# Patient Record
Sex: Male | Born: 1945 | Race: White | Hispanic: No | Marital: Married | Smoking: Never smoker
Health system: Southern US, Community
[De-identification: ages and names within clinical notes are randomized; demographics above are authoritative.]

## PROBLEM LIST (undated history)

## (undated) DIAGNOSIS — R972 Elevated prostate specific antigen [PSA]: Secondary | ICD-10-CM

## (undated) DIAGNOSIS — E785 Hyperlipidemia, unspecified: Secondary | ICD-10-CM

## (undated) DIAGNOSIS — N5089 Other specified disorders of the male genital organs: Secondary | ICD-10-CM

## (undated) DIAGNOSIS — K644 Residual hemorrhoidal skin tags: Secondary | ICD-10-CM

## (undated) HISTORY — DX: Residual hemorrhoidal skin tags: K64.4

## (undated) HISTORY — DX: Elevated prostate specific antigen (PSA): R97.20

## (undated) HISTORY — DX: Gilbert syndrome: E80.4

## (undated) HISTORY — DX: Hyperlipidemia, unspecified: E78.5

## (undated) HISTORY — DX: Other specified disorders of the male genital organs: N50.89

---

## 2001-10-28 ENCOUNTER — Ambulatory Visit (HOSPITAL_BASED_OUTPATIENT_CLINIC_OR_DEPARTMENT_OTHER): Admission: RE | Admit: 2001-10-28 | Discharge: 2001-10-28 | Payer: Self-pay | Admitting: Orthopedic Surgery

## 2001-10-28 ENCOUNTER — Encounter (INDEPENDENT_AMBULATORY_CARE_PROVIDER_SITE_OTHER): Payer: Self-pay | Admitting: *Deleted

## 2003-10-09 HISTORY — PX: HAND SURGERY: SHX662

## 2009-04-25 ENCOUNTER — Ambulatory Visit: Payer: Self-pay | Admitting: Family Medicine

## 2009-04-25 DIAGNOSIS — E785 Hyperlipidemia, unspecified: Secondary | ICD-10-CM

## 2009-04-25 DIAGNOSIS — M25559 Pain in unspecified hip: Secondary | ICD-10-CM | POA: Insufficient documentation

## 2009-04-26 ENCOUNTER — Ambulatory Visit: Payer: Self-pay | Admitting: Family Medicine

## 2009-04-26 DIAGNOSIS — R5381 Other malaise: Secondary | ICD-10-CM

## 2009-04-26 DIAGNOSIS — R5383 Other fatigue: Secondary | ICD-10-CM

## 2009-04-26 LAB — CONVERTED CEMR LAB
ALT: 26 units/L (ref 0–53)
Albumin: 4 g/dL (ref 3.5–5.2)
Alkaline Phosphatase: 60 units/L (ref 39–117)
Basophils Absolute: 0 10*3/uL (ref 0.0–0.1)
Bilirubin, Direct: 0.2 mg/dL (ref 0.0–0.3)
CO2: 31 meq/L (ref 19–32)
Calcium: 9.1 mg/dL (ref 8.4–10.5)
Chloride: 107 meq/L (ref 96–112)
Cholesterol: 219 mg/dL — ABNORMAL HIGH (ref 0–200)
Direct LDL: 172.4 mg/dL
Eosinophils Absolute: 0.2 10*3/uL (ref 0.0–0.7)
Glucose, Bld: 96 mg/dL (ref 70–99)
Hemoglobin: 15.4 g/dL (ref 13.0–17.0)
Lymphocytes Relative: 23.9 % (ref 12.0–46.0)
MCHC: 35 g/dL (ref 30.0–36.0)
Monocytes Relative: 11.1 % (ref 3.0–12.0)
Neutrophils Relative %: 58.4 % (ref 43.0–77.0)
PSA: 1.7 ng/mL (ref 0.10–4.00)
Platelets: 234 10*3/uL (ref 150.0–400.0)
RDW: 12.5 % (ref 11.5–14.6)
Sodium: 142 meq/L (ref 135–145)
Total CHOL/HDL Ratio: 5
Total Protein: 7.1 g/dL (ref 6.0–8.3)
Triglycerides: 55 mg/dL (ref 0.0–149.0)

## 2009-05-02 ENCOUNTER — Ambulatory Visit: Payer: Self-pay | Admitting: Family Medicine

## 2011-02-23 NOTE — Op Note (Signed)
[place]  Patient:    ULES, MARSALA Visit Number: [id number] MRN: [id number]          Service Type: DSU Location: [facility] Attending Physician:  Susa Day Dictated by:   Katy Fitch Naaman Plummer., M.D. Proc. Date: 10/28/01 Admit Date:  10/28/2001                             Operative Report  PREOPERATIVE DIAGNOSIS:  Dupuytrens contracture, right hand involving thumb, long and ring fingers with significant metacarpophalangeal flexion contractures of the long and ring fingers and extensive nodule involvement in the proximal phalangeal segments of the long and ring fingers.  POSTOPERATIVE DIAGNOSIS:  Dupuytrens contracture, right hand involving thumb, long and ring fingers with significant metacarpophalangeal flexion contractures of the long and ring fingers and extensive nodule involvement in the proximal phalangeal segments of the long and ring fingers.  OPERATION PERFORMED: 1. Excision of complex Dupuytrens contracture, right palm and ring finger. 2. Excision of Dupuytrens contracture, right long finger.  SURGEON:  Katy Fitch. Sypher, Montez Hageman., M.D.  ASSISTANT:  Jonni Sanger, P.A.  ANESTHESIA:  General by LMA.  SUPERVISING ANESTHESIOLOGIST:  Dr. Gelene Mink.  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  In 1988 he sustained an electrocution injury that burned his right hand.  Shortly following this injury he developed an early Dupuytrens contracture affecting his palm and metacarpophalangeal region of his long and ring fingers on the right. This has gradually progressed over the past 14 years to where he had developed a significant MP flexion contracture of the long and  ring fingers and extensive nodular disease of the palm in the region of the natatory ligaments between the long and ring fingers.  He had difficulty abducting  his long and ring fingers due to this involvement.  He requested surgical excision.  DESCRIPTION OF PROCEDURE:  Lavena Stanford, Montez Hageman. was brought to the operating room and placed in supine position on the operating table.  Following induction of general anesthesia by LMA, the right arm was prepped with Betadine soap and solution and sterilely draped.  Following exsanguination of the limb with an Esmarch bandage, the arterial tourniquet was inflated to 220 mHg.  The procedure commenced with planning of Brunner zigzag incisions. The skin flaps were elevated with scalpel and scissors dissection exposing the palmar fascia.  The pathologic palmar fascia was excised involving the pretendinous fibers to the long and ring fingers.  A very complex dissection in the region of the MP joints and proximal phalangeal segments of the ring and small fingers removed pretendinous disease.  A significant spiral band on the radial aspect of the ring finger and a very extensive lateral sheet and natatory ligament contracture between the long and ring fingers.  There was extensive disease along the lateral fascial sheet of the long finger.  The neurovascular bundle, particularly for the long and ring fingers was extensively involved with very complex dissection of the radial proper digital artery and nerve of the ring finger.  Ultimately, we were able to remove all the nodular fibromatosis without injury to the primary branches of the common digital arteries, proper digital arteries, common digital nerves, proper digital nerves.  The flexion contracture of the MP and PIP joints was fully relieved.  Bleeding points were electrocauterized throughout the dissection with bipolar forceps. The  wound was then lavaged with sterile saline followed by skin reduction in the ring finger to allow closure without dead space.  The wounds were then repaired with corner sutures of 5-0 nylon and multiple interrupted sutures  of 5-0 nylon.  The incisions were dressed with Silvadene, Xeroflo, sterile gauze, sterile Kerlix, Webril, acrylic fluff and a palmar splint maintaining the MP and PIP joints in full extension.  There were no apparent complications.  The patient tolerated the surgery and anesthesia well and was transferred to the recovery room with stable vital signs.  For aftercare he was given prescriptions for Percocet 5 mg 1 or 2 tablets p.o. q.4-6h. p.r.n. pain, 30 tablets without refill; also, Keflex 500 mg 1 p.o. q.8h. x 4 days as prophylactic antibiotic and Motrin 600 mg 1 p.o. q.6h. p.r.n. pain. Dictated by:   Katy Fitch Naaman Plummer., M.D. Attending Physician:  Susa Day DD:  10/28/01 TD:  10/28/01 Job: [phone] [id number]

## 2011-12-31 ENCOUNTER — Other Ambulatory Visit: Payer: Self-pay | Admitting: Family Medicine

## 2011-12-31 DIAGNOSIS — E785 Hyperlipidemia, unspecified: Secondary | ICD-10-CM

## 2011-12-31 DIAGNOSIS — R5381 Other malaise: Secondary | ICD-10-CM

## 2011-12-31 DIAGNOSIS — Z125 Encounter for screening for malignant neoplasm of prostate: Secondary | ICD-10-CM

## 2012-01-03 ENCOUNTER — Other Ambulatory Visit (INDEPENDENT_AMBULATORY_CARE_PROVIDER_SITE_OTHER): Payer: 59

## 2012-01-03 DIAGNOSIS — Z125 Encounter for screening for malignant neoplasm of prostate: Secondary | ICD-10-CM

## 2012-01-03 DIAGNOSIS — R5383 Other fatigue: Secondary | ICD-10-CM

## 2012-01-03 DIAGNOSIS — E785 Hyperlipidemia, unspecified: Secondary | ICD-10-CM

## 2012-01-03 LAB — COMPREHENSIVE METABOLIC PANEL
ALT: 18 U/L (ref 0–53)
BUN: 15 mg/dL (ref 6–23)
CO2: 31 mEq/L (ref 19–32)
Calcium: 9.2 mg/dL (ref 8.4–10.5)
Chloride: 103 mEq/L (ref 96–112)
Creatinine, Ser: 1.1 mg/dL (ref 0.4–1.5)
GFR: 72.6 mL/min (ref >60.00)
Glucose, Bld: 90 mg/dL (ref 70–99)
Total Bilirubin: 1.2 mg/dL (ref 0.3–1.2)

## 2012-01-03 LAB — LIPID PANEL
Cholesterol: 220 mg/dL — ABNORMAL HIGH (ref 0–200)
Total CHOL/HDL Ratio: 5
Triglycerides: 59 mg/dL (ref 0.0–149.0)

## 2012-01-03 LAB — CBC WITH DIFFERENTIAL/PLATELET
Basophils Absolute: 0 10*3/uL (ref 0.0–0.1)
Eosinophils Relative: 6.8 % — ABNORMAL HIGH (ref 0.0–5.0)
Hemoglobin: 15.1 g/dL (ref 13.0–17.0)
Lymphocytes Relative: 26 % (ref 12.0–46.0)
Monocytes Relative: 11.6 % (ref 3.0–12.0)
Neutro Abs: 2.5 10*3/uL (ref 1.4–7.7)
RDW: 13 % (ref 11.5–14.6)
WBC: 4.5 10*3/uL (ref 4.5–10.5)

## 2012-01-03 LAB — PSA: PSA: 3.12 ng/mL (ref 0.10–4.00)

## 2012-01-03 LAB — LDL CHOLESTEROL, DIRECT: Direct LDL: 176 mg/dL

## 2012-01-09 ENCOUNTER — Encounter: Payer: Self-pay | Admitting: Family Medicine

## 2012-01-10 ENCOUNTER — Encounter: Payer: Self-pay | Admitting: Family Medicine

## 2012-01-10 ENCOUNTER — Ambulatory Visit (INDEPENDENT_AMBULATORY_CARE_PROVIDER_SITE_OTHER): Payer: 59 | Admitting: Family Medicine

## 2012-01-10 VITALS — BP 120/82 | HR 54 | Temp 98.9°F | Ht 73.0 in | Wt 218.8 lb

## 2012-01-10 DIAGNOSIS — Z23 Encounter for immunization: Secondary | ICD-10-CM

## 2012-01-10 DIAGNOSIS — Z0000 Encounter for general adult medical examination without abnormal findings: Secondary | ICD-10-CM

## 2012-01-10 NOTE — Progress Notes (Signed)
Patient Name: Henry Kirk Date of Birth: January 14, 1946 Medical Record Number: [id number] Gender: male Date of Encounter: 01/10/2012  History of Present Illness:  Henry Kirk is a 66 y.o. very pleasant male patient who presents with the following:  Pneumovax Colonoscopy - needs recall.   Does have a family history of prostate cancer  Chol 220 LDL 170 No family history of CAD No smoker  Body mass index is 28.87 kg/(m^2).  Has not done as much this winter  Preventative Health Maintenance Visit:  Health Maintenance Summary Reviewed and updated, unless pt declines services.  Tobacco History Reviewed. Alcohol: No concerns, no excessive use Exercise Habits: Some activity, rec at least 30 mins 5 times a week STD concerns: no risk or activity to increase risk Drug Use: None Encouraged self-testicular check  Health Maintenance  Topic Date Due  . Tetanus/tdap  07/10/1964  . Pneumococcal Polysaccharide Vaccine Age 68 And Over  07/10/2010  . Colonoscopy  10/08/2010  . Influenza Vaccine  07/08/2012  . Zostavax  Completed   Labs reviewed with the patient.   Lipids:    Component Value Date/Time   CHOL 220* 01/03/2012 0839   TRIG 59.0 01/03/2012 0839   HDL 46.50 01/03/2012 0839   LDLDIRECT 176.0 01/03/2012 0839   VLDL 11.8 01/03/2012 0839   CHOLHDL 5 01/03/2012 0839    CBC:    Component Value Date/Time   WBC 4.5 01/03/2012 0839   HGB 15.1 01/03/2012 0839   HCT 43.9 01/03/2012 0839   PLT 243.0 01/03/2012 0839   MCV 93.3 01/03/2012 0839   NEUTROABS 2.5 01/03/2012 0839   LYMPHSABS 1.2 01/03/2012 0839   MONOABS 0.5 01/03/2012 0839   EOSABS 0.3 01/03/2012 0839   BASOSABS 0.0 01/03/2012 0839    Basic Metabolic Panel:    Component Value Date/Time   NA 141 01/03/2012 0839   K 4.6 01/03/2012 0839   CL 103 01/03/2012 0839   CO2 31 01/03/2012 0839   BUN 15 01/03/2012 0839   CREATININE 1.1 01/03/2012 0839   GLUCOSE 90 01/03/2012 0839   CALCIUM 9.2 01/03/2012 0839    Lab Results    Component Value Date   ALT 18 01/03/2012   AST 25 01/03/2012   ALKPHOS 66 01/03/2012   BILITOT 1.2 01/03/2012    Lab Results  Component Value Date   PSA 3.12 01/03/2012   PSA 1.70 04/26/2009    Patient Active Problem List  Diagnoses  . HYPERLIPIDEMIA  . HIP PAIN, RIGHT  . OTHER MALAISE AND FATIGUE   Past Medical History  Diagnosis Date  . Hyperlipidemia   . Elevated PSA     with prostatitis  . Gilbert's syndrome   . Hemorrhoids, external   . Spermatic cord cyst    No past surgical history on file. History  Substance Use Topics  . Smoking status: Never Smoker   . Smokeless tobacco: Not on file  . Alcohol Use: No   Family History  Problem Relation Age of Onset  . Cancer Father     back  . Cancer Son     testicular cancer  . Cancer Paternal Grandfather     back   No Known Allergies  Medication list has been reviewed and updated.  Review of Systems:  General: Denies fever, chills, sweats. No significant weight loss. Eyes: Denies blurring,significant itching ENT: Denies earache, sore throat, and hoarseness. Cardiovascular: Denies chest pains, palpitations, dyspnea on exertion Respiratory: Denies cough, dyspnea at rest,wheeezing Breast: no concerns about lumps GI: Denies  nausea, vomiting, diarrhea, constipation, change in bowel habits, abdominal pain, melena, hematochezia GU: Denies penile discharge, ED, urinary flow / outflow problems. No STD concerns. Musculoskeletal: Denies back pain, joint pain Derm: Denies rash, itching Neuro: Denies  paresthesias, frequent falls, frequent headaches Psych: Denies depression, anxiety Endocrine: Denies cold intolerance, heat intolerance, polydipsia Heme: Denies enlarged lymph nodes Allergy: No hayfever   Physical Examination: Filed Vitals:   01/10/12 0822  BP: 120/82  Pulse: 54  Temp: 98.9 F (37.2 C)  TempSrc: Oral  Height: 6\' 1"  (1.854 m)  Weight: 218 lb 12.8 oz (99.247 kg)  SpO2: 98%    Body mass index is  28.87 kg/(m^2).   Wt Readings from Last 3 Encounters:  01/10/12 218 lb 12.8 oz (99.247 kg)  05/02/09 209 lb 3.2 oz (94.892 kg)  04/25/09 211 lb 3.2 oz (95.8 kg)    GEN: well developed, well nourished, no acute distress Eyes: conjunctiva and lids normal, PERRLA, EOMI ENT: TM clear, nares clear, oral exam WNL Neck: supple, no lymphadenopathy, no thyromegaly, no JVD Pulm: clear to auscultation and percussion, respiratory effort normal CV: regular rate and rhythm, S1-S2, no murmur, rub or gallop, no bruits, peripheral pulses normal and symmetric, no cyanosis, clubbing, edema or varicosities Chest: no scars, masses GI: soft, non-tender; no hepatosplenomegaly, masses; active bowel sounds all quadrants GU: no hernia, testicular mass, penile discharge, or prostate enlargement Lymph: no cervical, axillary or inguinal adenopathy MSK: gait normal, muscle tone and strength WNL, no joint swelling, effusions, discoloration, crepitus  SKIN: clear, good turgor, color WNL, no rashes, lesions, or ulcerations Neuro: normal mental status, normal strength, sensation, and motion Psych: alert; oriented to person, place and time, normally interactive and not anxious or depressed in appearance.   Assessment and Plan: 1. Routine general medical examination at a health care facility     The patient's preventative maintenance and recommended screening tests for an annual wellness exam were reviewed in full today. Brought up to date unless services declined.  Counselled on the importance of diet, exercise, and its role in overall health and mortality. The patient's FH and SH was reviewed, including their home life, tobacco status, and drug and alcohol status.   Pneumovax Needs colon recall- he wants to call his wife's GI to set it up

## 2013-01-14 ENCOUNTER — Ambulatory Visit (INDEPENDENT_AMBULATORY_CARE_PROVIDER_SITE_OTHER): Payer: Medicare Other | Admitting: Family Medicine

## 2013-01-14 ENCOUNTER — Encounter: Payer: Self-pay | Admitting: Family Medicine

## 2013-01-14 VITALS — BP 110/74 | HR 52 | Temp 97.4°F | Ht 73.0 in | Wt 227.8 lb

## 2013-01-14 DIAGNOSIS — R5381 Other malaise: Secondary | ICD-10-CM

## 2013-01-14 DIAGNOSIS — N400 Enlarged prostate without lower urinary tract symptoms: Secondary | ICD-10-CM

## 2013-01-14 DIAGNOSIS — E785 Hyperlipidemia, unspecified: Secondary | ICD-10-CM

## 2013-01-14 DIAGNOSIS — Z0000 Encounter for general adult medical examination without abnormal findings: Secondary | ICD-10-CM

## 2013-01-14 DIAGNOSIS — R5383 Other fatigue: Secondary | ICD-10-CM

## 2013-01-14 LAB — LIPID PANEL
Cholesterol: 234 mg/dL — ABNORMAL HIGH (ref 0–200)
HDL: 38.6 mg/dL — ABNORMAL LOW (ref >39.00)
Total CHOL/HDL Ratio: 6
Triglycerides: 91 mg/dL (ref 0.0–149.0)
VLDL: 18.2 mg/dL (ref 0.0–40.0)

## 2013-01-14 LAB — CBC WITH DIFFERENTIAL/PLATELET
Basophils Absolute: 0 10*3/uL (ref 0.0–0.1)
Eosinophils Absolute: 0.4 10*3/uL (ref 0.0–0.7)
Lymphocytes Relative: 23.4 % (ref 12.0–46.0)
MCHC: 34.2 g/dL (ref 30.0–36.0)
MCV: 93.5 fl (ref 78.0–100.0)
Monocytes Absolute: 0.5 10*3/uL (ref 0.1–1.0)
Neutro Abs: 2.7 10*3/uL (ref 1.4–7.7)
Neutrophils Relative %: 56 % (ref 43.0–77.0)
RDW: 13.3 % (ref 11.5–14.6)

## 2013-01-14 LAB — BASIC METABOLIC PANEL
BUN: 12 mg/dL (ref 6–23)
Chloride: 104 mEq/L (ref 96–112)
Creatinine, Ser: 1.1 mg/dL (ref 0.4–1.5)
GFR: 71.61 mL/min (ref >60.00)
Potassium: 4 mEq/L (ref 3.5–5.1)

## 2013-01-14 LAB — PSA: PSA: 2.23 ng/mL (ref 0.10–4.00)

## 2013-01-14 LAB — HEPATIC FUNCTION PANEL
ALT: 21 U/L (ref 0–53)
Bilirubin, Direct: 0.3 mg/dL (ref 0.0–0.3)
Total Bilirubin: 2.5 mg/dL — ABNORMAL HIGH (ref 0.3–1.2)

## 2013-01-14 NOTE — Progress Notes (Signed)
Nature conservation officer at [facility] [address] [place] [address] Phone: [phone] Fax: [phone]  Date:  01/14/2013   Name:  Henry Kirk   DOB:  1946-01-27   MRN:  [id number] Gender: male Age: 67 y.o.  Primary Physician:  Hannah Beat, MD  Evaluating MD: Hannah Beat, MD   Chief Complaint: Annual Exam   History of Present Illness:  Henry Kirk is a 67 y.o. pleasant patient who presents with the following:  Medicare CPX:  F/u colon: [facility].  Benign tremor? Tremor in his head sometimes. Cannot keep her head back. Body pushes head farther.   Wt Readings from Last 3 Encounters:  01/14/13 227 lb 12 oz (103.307 kg)  01/10/12 218 lb 12.8 oz (99.247 kg)  05/02/09 209 lb 3.2 oz (94.892 kg)   Preventative Health Maintenance Visit:  Health Maintenance Summary Reviewed and updated, unless pt declines services.  Tobacco History Reviewed. Alcohol: No concerns, no excessive use Exercise Habits: none currently STD concerns: no risk or activity to increase risk Drug Use: None Encouraged self-testicular check  Health Maintenance  Topic Date Due  . Tetanus/tdap  07/10/1964  . Colonoscopy  10/08/2010  . Influenza Vaccine  06/08/2013  . Pneumococcal Polysaccharide Vaccine Age 14 And Over  Completed  . Zostavax  Completed    Labs reviewed with the patient.  Results for orders placed in visit on 01/14/13  CBC WITH DIFFERENTIAL      Result Value Range   WBC 4.9  4.5 - 10.5 K/uL   RBC 4.73  4.22 - 5.81 Mil/uL   Hemoglobin 15.1  13.0 - 17.0 g/dL   HCT 29.5  28.4 - 13.2 %   MCV 93.5  78.0 - 100.0 fl   MCHC 34.2  30.0 - 36.0 g/dL   RDW 44.0  10.2 - 72.5 %   Platelets 267.0  150.0 - 400.0 K/uL   Neutrophils Relative 56.0  43.0 - 77.0 %   Lymphocytes Relative 23.4  12.0 - 46.0 %   Monocytes Relative 10.8  3.0 - 12.0 %   Eosinophils Relative 8.8 (*) 0.0 - 5.0 %   Basophils Relative 1.0  0.0 - 3.0 %   Neutro Abs 2.7  1.4 - 7.7 K/uL   Lymphs Abs  1.1  0.7 - 4.0 K/uL   Monocytes Absolute 0.5  0.1 - 1.0 K/uL   Eosinophils Absolute 0.4  0.0 - 0.7 K/uL   Basophils Absolute 0.0  0.0 - 0.1 K/uL  BASIC METABOLIC PANEL      Result Value Range   Sodium 141  135 - 145 mEq/L   Potassium 4.0  3.5 - 5.1 mEq/L   Chloride 104  96 - 112 mEq/L   CO2 30  19 - 32 mEq/L   Glucose, Bld 90  70 - 99 mg/dL   BUN 12  6 - 23 mg/dL   Creatinine, Ser 1.1  0.4 - 1.5 mg/dL   Calcium 9.1  8.4 - 36.6 mg/dL   GFR 44.03  >47.42 mL/min  HEPATIC FUNCTION PANEL      Result Value Range   Total Bilirubin 2.5 (*) 0.3 - 1.2 mg/dL   Bilirubin, Direct 0.3  0.0 - 0.3 mg/dL   Alkaline Phosphatase 63  39 - 117 U/L   AST 26  0 - 37 U/L   ALT 21  0 - 53 U/L   Total Protein 7.0  6.0 - 8.3 g/dL   Albumin 4.0  3.5 - 5.2 g/dL  LIPID PANEL      Result Value Range   Cholesterol 234 (*) 0 - 200 mg/dL   Triglycerides 78.2  0.0 - 149.0 mg/dL   HDL 95.62 (*) >13.08 mg/dL   VLDL 65.7  0.0 - 84.6 mg/dL   Total CHOL/HDL Ratio 6    PSA      Result Value Range   PSA 2.23  0.10 - 4.00 ng/mL  LDL CHOLESTEROL, DIRECT      Result Value Range   Direct LDL 177.5        Patient Active Problem List  Diagnosis  . HYPERLIPIDEMIA  . HIP PAIN, RIGHT  . OTHER MALAISE AND FATIGUE    Past Medical History  Diagnosis Date  . Hyperlipidemia   . Elevated PSA     with prostatitis  . Gilbert's syndrome   . Hemorrhoids, external   . Spermatic cord cyst     History reviewed. No pertinent past surgical history.  History   Social History  . Marital Status: Married    Spouse Name: N/A    Number of Children: N/A  . Years of Education: N/A   Occupational History  . electrician Nurse, adult [employer]   Social History Main Topics  . Smoking status: Never Smoker   . Smokeless tobacco: Not on file  . Alcohol Use: No  . Drug Use: No  . Sexually Active: Not on file   Other Topics Concern  . Not on file   Social History Narrative  . No narrative on file    Family History    Problem Relation Age of Onset  . Cancer Father     back  . Cancer Son     testicular cancer  . Cancer Paternal Grandfather     back    No Known Allergies  Medication list has been reviewed and updated.  No outpatient prescriptions prior to visit.   No facility-administered medications prior to visit.    Review of Systems:   General: Denies fever, chills, sweats. No significant weight loss. Eyes: Denies blurring,significant itching ENT: Denies earache, sore throat, and hoarseness. Cardiovascular: Denies chest pains, palpitations, dyspnea on exertion Respiratory: Denies cough, dyspnea at rest,wheeezing Breast: no concerns about lumps GI: Denies nausea, vomiting, diarrhea, constipation, change in bowel habits, abdominal pain, melena, hematochezia GU: Denies penile discharge, ED, urinary flow / outflow problems. No STD concerns. Musculoskeletal: Denies back pain, joint pain Derm: Denies rash, itching Neuro: TREMOR AS ABOVE, WHICH RUNS IN HIS FAMILY. SLIGHTLY WORSENED WITH AGE. Psych: Denies depression, anxiety Endocrine: Denies cold intolerance, heat intolerance, polydipsia Heme: Denies enlarged lymph nodes Allergy: No hayfever   Physical Examination: BP 110/74  Pulse 52  Temp(Src) 97.4 F (36.3 C) (Oral)  Ht 6\' 1"  (1.854 m)  Wt 227 lb 12 oz (103.307 kg)  BMI 30.05 kg/m2  SpO2 96%  Ideal Body Weight: Weight in (lb) to have BMI = 25: 189.1   Wt Readings from Last 3 Encounters:  01/14/13 227 lb 12 oz (103.307 kg)  01/10/12 218 lb 12.8 oz (99.247 kg)  05/02/09 209 lb 3.2 oz (94.892 kg)    GEN: well developed, well nourished, no acute distress Eyes: conjunctiva and lids normal, PERRLA, EOMI ENT: TM clear, nares clear, oral exam WNL Neck: supple, no lymphadenopathy, no thyromegaly, no JVD Pulm: clear to auscultation and percussion, respiratory effort normal CV: regular rate and rhythm, S1-S2, no murmur, rub or gallop, no bruits, peripheral pulses normal and  symmetric, no cyanosis, clubbing, edema or varicosities Chest:  no scars, masses GI: soft, non-tender; no hepatosplenomegaly, masses; active bowel sounds all quadrants GU: no hernia, testicular mass, penile discharge, or prostate enlargement Lymph: no cervical, axillary or inguinal adenopathy MSK: gait normal, muscle tone and strength WNL, no joint swelling, effusions, discoloration, crepitus  SKIN: clear, good turgor, color WNL, no rashes, lesions, or ulcerations Neuro: normal mental status, normal strength, sensation, and motion Psych: alert; oriented to person, place and time, normally interactive and not anxious or depressed in appearance.  Assessment and Plan:  Routine general medical examination at a health care facility  HYPERLIPIDEMIA - Plan: Lipid panel  BPH (benign prostatic hyperplasia) - Plan: PSA  Other malaise and fatigue - Plan: CBC with Differential, Basic metabolic panel, Hepatic function panel  I have personally reviewed the Medicare Annual Wellness questionnaire and have noted 1. The patient's medical and social history 2. Their use of alcohol, tobacco or illicit drugs 3. Their current medications and supplements 4. The patient's functional ability including ADL's, fall risks, home safety risks and hearing or visual             impairment. 5. Diet and physical activities 6. Evidence for depression or mood disorders  The patients weight, height, BMI and visual acuity have been recorded in the chart I have made referrals, counseling and provided education to the patient based review of the above and I have provided the pt with a written personalized care plan for preventive services.  I have provided the patient with a copy of your personalized plan for preventive services. Instructed to take the time to review along with their updated medication list.   Recommended colonoscopy. He wants to check with his wife, and find out who did her colonoscopy and arrange for  this himself.  Orders Today:  Orders Placed This Encounter  Procedures  . CBC with Differential  . Basic metabolic panel  . Hepatic function panel  . Lipid panel  . PSA  . LDL cholesterol, direct    Updated Medication List: (Includes new medications, updates to list, dose adjustments) No orders of the defined types were placed in this encounter.    Medications Discontinued: There are no discontinued medications.     Signed, Elpidio Galea. Sanvika Cuttino, MD 01/14/2013 8:44 AM

## 2013-01-15 ENCOUNTER — Encounter: Payer: Self-pay | Admitting: *Deleted

## 2013-09-11 ENCOUNTER — Encounter: Payer: Self-pay | Admitting: *Deleted

## 2014-07-01 ENCOUNTER — Encounter: Payer: Self-pay | Admitting: Family Medicine

## 2014-07-01 ENCOUNTER — Ambulatory Visit (INDEPENDENT_AMBULATORY_CARE_PROVIDER_SITE_OTHER): Payer: Medicare Other | Admitting: Family Medicine

## 2014-07-01 VITALS — BP 120/78 | HR 60 | Temp 97.9°F | Wt 218.0 lb

## 2014-07-01 DIAGNOSIS — G252 Other specified forms of tremor: Secondary | ICD-10-CM

## 2014-07-01 DIAGNOSIS — E785 Hyperlipidemia, unspecified: Secondary | ICD-10-CM

## 2014-07-01 DIAGNOSIS — Z23 Encounter for immunization: Secondary | ICD-10-CM

## 2014-07-01 DIAGNOSIS — Z125 Encounter for screening for malignant neoplasm of prostate: Secondary | ICD-10-CM

## 2014-07-01 DIAGNOSIS — G250 Essential tremor: Secondary | ICD-10-CM | POA: Insufficient documentation

## 2014-07-01 LAB — LIPID PANEL
Cholesterol: 263 mg/dL — ABNORMAL HIGH (ref 0–200)
HDL: 46.6 mg/dL (ref >39.00)
LDL Cholesterol: 195 mg/dL — ABNORMAL HIGH (ref 0–99)
NonHDL: 216.4
TRIGLYCERIDES: 105 mg/dL (ref 0.0–149.0)
Total CHOL/HDL Ratio: 6
VLDL: 21 mg/dL (ref 0.0–40.0)

## 2014-07-01 LAB — COMPREHENSIVE METABOLIC PANEL
ALK PHOS: 67 U/L (ref 39–117)
ALT: 20 U/L (ref 0–53)
AST: 28 U/L (ref 0–37)
Albumin: 4.1 g/dL (ref 3.5–5.2)
BUN: 11 mg/dL (ref 6–23)
CALCIUM: 9.4 mg/dL (ref 8.4–10.5)
CHLORIDE: 102 meq/L (ref 96–112)
CO2: 34 mEq/L — ABNORMAL HIGH (ref 19–32)
Creatinine, Ser: 1.2 mg/dL (ref 0.4–1.5)
GFR: 67.02 mL/min (ref >60.00)
GLUCOSE: 82 mg/dL (ref 70–99)
Potassium: 4.6 mEq/L (ref 3.5–5.1)
SODIUM: 140 meq/L (ref 135–145)
TOTAL PROTEIN: 7.1 g/dL (ref 6.0–8.3)
Total Bilirubin: 1.6 mg/dL — ABNORMAL HIGH (ref 0.2–1.2)

## 2014-07-01 LAB — TSH: TSH: 1.31 u[IU]/mL (ref 0.35–4.50)

## 2014-07-01 LAB — PSA: PSA: 2.37 ng/mL (ref 0.10–4.00)

## 2014-07-01 NOTE — Progress Notes (Signed)
Henry Reddish, MD Phone: [phone]  Subjective:  Patient presents today to establish care with me as their new primary care provider. Patient was formerly a patient of Dr. Lorelei Pont. Chief complaint-noted.   Hyperlipidemia Last LDL >160.  Discussed likely elevated cardiac risk. Check lipids today to calculate 10 year risk.  ROS- no chest pain or shortness of breath.   Tremor Since he was a teenager. Slightly worsening over the years. Mainly intention. Occasionally head will have fine tremor as well. Strong family history of this with mother having difficulty with cups of liquids in her later years.  ROS-no rigidity, no falls, no confusion  The following were reviewed and entered/updated in epic: Past Medical History  Diagnosis Date  . Hyperlipidemia   . Elevated PSA     with prostatitis  . Gilbert's syndrome   . Hemorrhoids, external   . Spermatic cord cyst    Patient Active Problem List   Diagnosis Date Noted  . Essential tremor 07/01/2014    Priority: Medium  . 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 HYPERLIPIDEMIA 04/25/2009    Priority: Medium  . Other malaise and fatigue 04/26/2009    Priority: Low   Past Surgical History  Procedure Laterality Date  . Hand surgery  2005    "lumps on head"    Family History  Problem Relation Age of Onset  . Cancer Father     spinal 73  . Cancer Son     testicular cancer  . Cancer Paternal Grandfather     spinal 94  . Prostate cancer Maternal Uncle     did not die of    Medications- reviewed and updated No current outpatient prescriptions on file.   No current facility-administered medications for this visit.    Allergies-reviewed and updated No Known Allergies  History   Social History  . Marital Status: Married    Spouse Name: N/A    Number of Children: N/A  . Years of Education: N/A   Occupational History  . electrician Engineer, production [employer]   Social History Main Topics  . Smoking status:  Never Smoker   . Smokeless tobacco: None  . Alcohol Use: No  . Drug Use: No  . Sexual Activity: None   Other Topics Concern  . None   Social History Narrative   Married 1966. 2 sons Randall Hiss, not married and no kids, and Larkin Ina with 1 daughter, not married. All children live at home.       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       Hobbies: yardwork, working at [employer].     ROS--See HPI   Objective: BP 120/78  Pulse 60  Temp(Src) 97.9 F (36.6 C)  Wt 218 lb (98.884 kg) Gen: NAD, resting comfortably, moves onto table easily HEENT: Mucous membranes are moist. Oropharynx normal. Good dentition.  Neck: no thyromegaly CV: RRR no murmurs rubs or gallops Lungs: CTAB no crackles, wheeze, rhonchi Abdomen: soft/nontender/nondistended/normal bowel sounds.  Ext: no edema2+ PT pulses and radial pulses Skin: warm, dry, a couple actinic keratosis noted (going to dermatology later this month) Neuro: grossly normal, moves all extremities, PERRLA  Assessment/Plan:  HYPERLIPIDEMIA Discussed likely elevated still. Discussed likely need for statin. Advised regular exercise and diet changes. Patient states unlikely to take statin and in that case-I recommended asa 81mg .   Gilbert's syndrome Check LFTs today  Essential tremor Long term issue worsening slowly with time. Discussed beta blocker  as option. Patient not currently interested.    Fasting labs Orders Placed This Encounter  Procedures  . Tdap vaccine greater than or equal to 7yo IM  . PSA  . Lipid panel    [place]    Order Specific Question:  Has the patient fasted?    Answer:  No  . Comprehensive metabolic panel    [place]    Order Specific Question:  Has the patient fasted?    Answer:  No  . TSH    [place]

## 2014-07-01 NOTE — Patient Instructions (Addendum)
Check labs today.  Likely to recommend cholesterol lowering medicine.  Regardless, recommend 150 minutes exercise per week.  If you do not want cholesterol lowering medicine, would recommend daily aspirin.   You opted for yearly prostate cancer screening. We will do a rectal exam at your next visit.   Suspect your tremor is essential tremor  See you next October and hopefully I will be trained at that time for medicare wellness exam requirements.   Thanks, Dr. Yong Channel  Health Maintenance Due  Topic Date Due  . Tetanus/tdap - today 07/10/1964  . Colonoscopy -strongly recommend, we can schedule for you when you become ready 10/08/2010

## 2014-07-01 NOTE — Assessment & Plan Note (Signed)
Long term issue worsening slowly with time. Discussed beta blocker as option. Patient not currently interested.

## 2014-07-01 NOTE — Assessment & Plan Note (Signed)
Check LFTs today. 

## 2014-07-01 NOTE — Assessment & Plan Note (Signed)
Discussed likely elevated still. Discussed likely need for statin. Advised regular exercise and diet changes. Patient states unlikely to take statin and in that case-I recommended asa 81mg .

## 2015-02-21 DIAGNOSIS — Z85828 Personal history of other malignant neoplasm of skin: Secondary | ICD-10-CM | POA: Diagnosis not present

## 2015-02-21 DIAGNOSIS — L570 Actinic keratosis: Secondary | ICD-10-CM | POA: Diagnosis not present

## 2015-02-21 DIAGNOSIS — Z08 Encounter for follow-up examination after completed treatment for malignant neoplasm: Secondary | ICD-10-CM | POA: Diagnosis not present

## 2015-02-21 DIAGNOSIS — L821 Other seborrheic keratosis: Secondary | ICD-10-CM | POA: Diagnosis not present

## 2015-02-21 DIAGNOSIS — Z1283 Encounter for screening for malignant neoplasm of skin: Secondary | ICD-10-CM | POA: Diagnosis not present

## 2015-06-23 DIAGNOSIS — X32XXXD Exposure to sunlight, subsequent encounter: Secondary | ICD-10-CM | POA: Diagnosis not present

## 2015-06-23 DIAGNOSIS — L820 Inflamed seborrheic keratosis: Secondary | ICD-10-CM | POA: Diagnosis not present

## 2015-06-23 DIAGNOSIS — L570 Actinic keratosis: Secondary | ICD-10-CM | POA: Diagnosis not present

## 2015-06-23 DIAGNOSIS — L821 Other seborrheic keratosis: Secondary | ICD-10-CM | POA: Diagnosis not present

## 2015-07-12 ENCOUNTER — Encounter: Payer: Self-pay | Admitting: Family Medicine

## 2015-07-12 ENCOUNTER — Encounter: Payer: Medicare Other | Admitting: Family Medicine

## 2015-07-12 ENCOUNTER — Ambulatory Visit (INDEPENDENT_AMBULATORY_CARE_PROVIDER_SITE_OTHER): Payer: Medicare Other | Admitting: Family Medicine

## 2015-07-12 VITALS — BP 136/76 | HR 58 | Temp 98.1°F | Ht 73.0 in | Wt 224.0 lb

## 2015-07-12 DIAGNOSIS — E785 Hyperlipidemia, unspecified: Secondary | ICD-10-CM

## 2015-07-12 DIAGNOSIS — Z0000 Encounter for general adult medical examination without abnormal findings: Secondary | ICD-10-CM

## 2015-07-12 DIAGNOSIS — R351 Nocturia: Secondary | ICD-10-CM

## 2015-07-12 DIAGNOSIS — N401 Enlarged prostate with lower urinary tract symptoms: Secondary | ICD-10-CM | POA: Diagnosis not present

## 2015-07-12 DIAGNOSIS — Z1211 Encounter for screening for malignant neoplasm of colon: Secondary | ICD-10-CM

## 2015-07-12 LAB — POCT URINALYSIS DIPSTICK
Bilirubin, UA: NEGATIVE
Glucose, UA: NEGATIVE
LEUKOCYTES UA: NEGATIVE
NITRITE UA: NEGATIVE
PH UA: 6
PROTEIN UA: NEGATIVE
RBC UA: NEGATIVE
Spec Grav, UA: 1.025
Urobilinogen, UA: 1

## 2015-07-12 LAB — COMPREHENSIVE METABOLIC PANEL
ALK PHOS: 65 U/L (ref 39–117)
ALT: 19 U/L (ref 0–53)
AST: 25 U/L (ref 0–37)
Albumin: 4.1 g/dL (ref 3.5–5.2)
BUN: 13 mg/dL (ref 6–23)
CALCIUM: 9.7 mg/dL (ref 8.4–10.5)
CO2: 31 meq/L (ref 19–32)
CREATININE: 1.09 mg/dL (ref 0.40–1.50)
Chloride: 104 mEq/L (ref 96–112)
GFR: 71.08 mL/min (ref >60.00)
GLUCOSE: 78 mg/dL (ref 70–99)
Potassium: 4.4 mEq/L (ref 3.5–5.1)
Sodium: 143 mEq/L (ref 135–145)
TOTAL PROTEIN: 7 g/dL (ref 6.0–8.3)
Total Bilirubin: 1.6 mg/dL — ABNORMAL HIGH (ref 0.2–1.2)

## 2015-07-12 LAB — TSH: TSH: 1.52 u[IU]/mL (ref 0.35–4.50)

## 2015-07-12 LAB — CBC
HCT: 46.3 % (ref 39.0–52.0)
HEMOGLOBIN: 15.6 g/dL (ref 13.0–17.0)
MCHC: 33.7 g/dL (ref 30.0–36.0)
MCV: 93.6 fl (ref 78.0–100.0)
PLATELETS: 257 10*3/uL (ref 150.0–400.0)
RBC: 4.95 Mil/uL (ref 4.22–5.81)
RDW: 13.4 % (ref 11.5–15.5)
WBC: 6.1 10*3/uL (ref 4.0–10.5)

## 2015-07-12 LAB — LIPID PANEL
CHOL/HDL RATIO: 5
Cholesterol: 251 mg/dL — ABNORMAL HIGH (ref 0–200)
HDL: 48.9 mg/dL (ref >39.00)
LDL Cholesterol: 183 mg/dL — ABNORMAL HIGH (ref 0–99)
NONHDL: 202.19
Triglycerides: 94 mg/dL (ref 0.0–149.0)
VLDL: 18.8 mg/dL (ref 0.0–40.0)

## 2015-07-12 LAB — PSA: PSA: 2.22 ng/mL (ref 0.10–4.00)

## 2015-07-12 NOTE — Progress Notes (Signed)
Henry Reddish, MD Phone: [phone]  Subjective:  Patient presents today for their annual physical. Chief complaint-noted.   -Patient has done well since last visit. He continues to have a tremor but not worsening. Continues to not interested in taking cholesterol medicine but has been compliant with aspirin at least every other day ROS- full  review of systems was completed and negative with pertinent: No chest pain shortness of breath. No headache or blurred vision. Does have some nocturia  The following were reviewed and entered/updated in epic: Past Medical History  Diagnosis Date  . Hyperlipidemia   . Elevated PSA     with prostatitis  . Gilbert's syndrome   . Hemorrhoids, external   . Spermatic cord cyst    Patient Active Problem List   Diagnosis Date Noted  . Essential tremor 07/01/2014    Priority: Medium  . 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 Hyperlipidemia 04/25/2009    Priority: Medium  . Prostate cancer screening 07/01/2014    Priority: Low  . Other malaise and fatigue 04/26/2009    Priority: Low   Past Surgical History  Procedure Laterality Date  . Hand surgery  2005    "lumps on head"    Family History  Problem Relation Age of Onset  . Cancer Father     spinal 71  . Cancer Son     testicular cancer  . Cancer Paternal Grandfather     spinal 39  . Prostate cancer Maternal Uncle     did not die of    Medications- reviewed and updated Current Outpatient Prescriptions  Medication Sig Dispense Refill  . aspirin 81 MG tablet Take 81 mg by mouth daily.     No current facility-administered medications for this visit.    Allergies-reviewed and updated No Known Allergies  Social History   Social History  . Marital Status: Married    Spouse Name: N/A  . Number of Children: N/A  . Years of Education: N/A   Occupational History  . electrician Engineer, [employer]   Social History Main Topics  . Smoking status: Never Smoker     . Smokeless tobacco: None  . Alcohol Use: No  . Drug Use: No  . Sexual Activity: Not Asked   Other Topics Concern  . None   Social History Narrative   Married 1966. 2 sons Randall Hiss, not married and no kids, and Larkin Ina with 1 daughter, not married. All children live at home.       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       Hobbies: yardwork, working at [employer].     ROS--See HPI   Objective: BP 136/76 mmHg  Pulse 58  Temp(Src) 98.1 F (36.7 C)  Ht 6\' 1"  (1.854 m)  Wt 224 lb (101.606 kg)  BMI 29.56 kg/m2 Gen: NAD, resting comfortably HEENT: Mucous membranes are moist. Oropharynx normal Neck: no thyromegaly CV: RRR no murmurs rubs or gallops Lungs: CTAB no crackles, wheeze, rhonchi Abdomen: soft/nontender/nondistended/normal bowel sounds. No rebound or guarding.  Rectal: normal tone, diffusely enlarged prostate, no masses or tenderness Ext: no edema, 2+ PT pulses Skin: warm, dry Neuro: grossly normal, moves all extremities, PERRLA   Assessment/Plan:  69 y.o. male presenting for annual physical.  Health Maintenance counseling: 1. Anticipatory guidance: Patient counseled regarding regular dental exams, regular eye exam, wearing seatbelts, wearing sunscreen 2. Risk factor reduction:  Advised patient of need for regular exercise and diet  rich and fruits and vegetables to reduce risk of heart attack and stroke. Exercise lacking. Goal 210 on home scales 3. Immunizations/screenings/ancillary studies- declined flu and pneumonia, will do later Health Maintenance Due  Topic Date Due  . Hepatitis C Screening - declines  11-Apr-1946  . COLONOSCOPY - referred today 10/08/2010  4. Prostate cancer screening- BPH on exam, PSA today Nocturia 3x a night and enlarged prostate 5. Colon cancer screening - as above 6. Skin cancer screening- sees dermatology Dr. Allyn Kenner in [place]  Initial blood pressure was elevated. Controlled on  repeat. Do not like trend regardless. We will plan on BP home monitoring with follow-up per AVS. Follow-up 6-8 weeks  Recommend cholesterol medicine once again. Patient declined   Sooner Return precautions advised.   Fasting- summary: Bilirubin remaisn slightly high per likely baseline. Other liver tests normal Continue aspirin but still would recommend cholesterol medicine with total >250, bad cholesterol >180. He has declined in past, can write rx for him if he changes his mind PSA similar to levels over last 2 years- low risk prostate cancer Urine and blood counts and liver and kidney and thyroid largely normal  Orders Placed This Encounter  Procedures  . CBC    [place]  . Comprehensive metabolic panel    [place]    Order Specific Question:  Has the patient fasted?    Answer:  No  . Lipid panel    [place]    Order Specific Question:  Has the patient fasted?    Answer:  No  . PSA  . TSH    [place]  . Ambulatory referral to Gastroenterology    Referral Priority:  Routine    Referral Type:  Consultation    Referral Reason:  Specialty Services Required    Number of Visits Requested:  1  . POCT urinalysis dipstick    In house    Meds ordered this encounter  Medications  . aspirin 81 MG tablet    Sig: Take 81 mg by mouth daily.

## 2015-07-12 NOTE — Patient Instructions (Addendum)
[facility] will call you to schedule colonoscopy.  Labs before you leave Likely to recommend cholesterol medicine Continue aspirin 81mg - every other day ok  Your blood pressure trend concerns me and seems like you have som high numbers at home. I would like for you to buy/use a home cuff to check at least 4x a week. Your goal is <140/90. If you note in the next few weeks that it is higher than our goal, see me sooner. Otherwise, see me in 6-8 weeks for blood pressure check. Bring your cuff and your log with you.

## 2016-04-05 DIAGNOSIS — X32XXXD Exposure to sunlight, subsequent encounter: Secondary | ICD-10-CM | POA: Diagnosis not present

## 2016-04-05 DIAGNOSIS — L820 Inflamed seborrheic keratosis: Secondary | ICD-10-CM | POA: Diagnosis not present

## 2016-04-05 DIAGNOSIS — C44311 Basal cell carcinoma of skin of nose: Secondary | ICD-10-CM | POA: Diagnosis not present

## 2016-04-05 DIAGNOSIS — L570 Actinic keratosis: Secondary | ICD-10-CM | POA: Diagnosis not present

## 2016-05-07 DIAGNOSIS — L820 Inflamed seborrheic keratosis: Secondary | ICD-10-CM | POA: Diagnosis not present

## 2016-05-07 DIAGNOSIS — Z08 Encounter for follow-up examination after completed treatment for malignant neoplasm: Secondary | ICD-10-CM | POA: Diagnosis not present

## 2016-05-07 DIAGNOSIS — Z85828 Personal history of other malignant neoplasm of skin: Secondary | ICD-10-CM | POA: Diagnosis not present

## 2017-02-13 ENCOUNTER — Ambulatory Visit (INDEPENDENT_AMBULATORY_CARE_PROVIDER_SITE_OTHER): Payer: Medicare Other | Admitting: Family Medicine

## 2017-02-13 ENCOUNTER — Ambulatory Visit (INDEPENDENT_AMBULATORY_CARE_PROVIDER_SITE_OTHER): Payer: Medicare Other

## 2017-02-13 ENCOUNTER — Encounter: Payer: Self-pay | Admitting: Family Medicine

## 2017-02-13 VITALS — BP 160/76 | HR 50 | Temp 97.7°F | Ht 73.0 in | Wt 223.4 lb

## 2017-02-13 VITALS — BP 160/76 | HR 52 | Temp 97.7°F | Ht 73.0 in | Wt 223.7 lb

## 2017-02-13 DIAGNOSIS — R001 Bradycardia, unspecified: Secondary | ICD-10-CM | POA: Diagnosis not present

## 2017-02-13 DIAGNOSIS — Z0000 Encounter for general adult medical examination without abnormal findings: Secondary | ICD-10-CM

## 2017-02-13 LAB — COMPREHENSIVE METABOLIC PANEL
ALT: 17 U/L (ref 0–53)
AST: 26 U/L (ref 0–37)
Albumin: 4.3 g/dL (ref 3.5–5.2)
Alkaline Phosphatase: 62 U/L (ref 39–117)
BUN: 12 mg/dL (ref 6–23)
CALCIUM: 9.6 mg/dL (ref 8.4–10.5)
CHLORIDE: 103 meq/L (ref 96–112)
CO2: 28 meq/L (ref 19–32)
Creatinine, Ser: 1.19 mg/dL (ref 0.40–1.50)
GFR: 63.94 mL/min (ref >60.00)
Glucose, Bld: 89 mg/dL (ref 70–99)
Potassium: 4.4 mEq/L (ref 3.5–5.1)
Sodium: 141 mEq/L (ref 135–145)
Total Bilirubin: 2.2 mg/dL — ABNORMAL HIGH (ref 0.2–1.2)
Total Protein: 6.9 g/dL (ref 6.0–8.3)

## 2017-02-13 LAB — CBC
HCT: 45.7 % (ref 39.0–52.0)
HEMOGLOBIN: 15.8 g/dL (ref 13.0–17.0)
MCHC: 34.5 g/dL (ref 30.0–36.0)
MCV: 92.6 fl (ref 78.0–100.0)
PLATELETS: 278 10*3/uL (ref 150.0–400.0)
RBC: 4.94 Mil/uL (ref 4.22–5.81)
RDW: 13.6 % (ref 11.5–15.5)
WBC: 4.1 10*3/uL (ref 4.0–10.5)

## 2017-02-13 LAB — TSH: TSH: 1.28 u[IU]/mL (ref 0.35–4.50)

## 2017-02-13 NOTE — Addendum Note (Signed)
Addended by: Marin Olp on: 02/13/2017 07:02 PM   Modules accepted: Level of Service

## 2017-02-13 NOTE — Progress Notes (Signed)
I have reviewed and agree with note, evaluation, plan. Evaluated today for lightheadedness and low heart rate. I asked him to return to discuss tremor after cardiac evaluation. Wife or patient did not mention the comment about the "[place] sheets coming down over eyes" and unclear what this means.   Garret Reddish, MD

## 2017-02-13 NOTE — Progress Notes (Signed)
Subjective:   Henry Kirk is a 71 y.o. male who presents for Medicare Annual/Subsequent preventive examination.   HRA assessment completed during this visit with Henry Kirk  The Patient was informed that the wellness visit is to identify future health risk and educate and initiate measures that can reduce risk for increased disease through the lifespan.    NO ROS; Medicare Wellness Visit  Last OV:  07/12/2015 Labs completed: seeing Dr. Yong Channel today  Family hx cancer  Describes health as fair, good or great? Good States he has had a few issues  c/o  BP has been running up on occasion; 150 to 160 range Pulse was low in general most of his life  Dentist it was 68 and 33 HR;  Does have some "light headedness" at times No c/o of other symptoms  Reviewed status with Dr. Yong Channel / to work in to see today   Update:  Tobacco: never smoked  2nd Hand Smoke no Chew or electronic cigarettes ETOH: no   Medications: not taking ASA 110m  Is not taking any meds    BMI: 29.4   Diet;   Breakfast; eats left overs Cup of coffee and banana  Or cereal Lunch; [employer]; - burgers Supper -  Discussed eating "good" beef if he likes beef Wife cooks the meals   Exercise;  yard work Works around the home Physical exercise; stays busy but does not go to [employer] Counseled he and wife on the need for ongoing exercise based on what they enjoy; need to start now prior to entering late 70's and 80's to continue to be toned and stay balanced   Safety features reviewed for safe community;  Has moved and has everything on the first floor Does have an upstairs but doesn't have to go up stairs  firearms if in the home will be kept in a safe place  smoke alarms; yes  sun protection when outside; wears sunscreen; hat as well driving difficulties or accidents; no accidents  Advanced Directive given information  Will consider completing    HOME SAFETY;  Fall hx; no Fall risk: Fear of  falling? no Gait: nomral Given education on "Fall Prevention in the Home" for more safety tips the patient can apply as appropriate.  Long term goal is to "age in place"    Mental Health:  Any emotional problems? Anxious, depressed, irritable, sad or blue? no Denies feeling depressed or hopeless; voices pleasure in daily life How many social activities have you been engaged in within the last 2 weeks? no  Pain: no c/o  Cognitive;  Manages checkbook, medications; no failures of task Ad8 score reviewed for issues;  Issues making decisions; no  Less interest in hobbies / activities" no  Repeats questions, stories; family complaining: NO  Trouble using ordinary gadgets; microwave; computer: no  Forgets the month or year: no  Mismanaging finances: no  Missing apt: no but does write them down  Daily problems with thinking of memory NO Ad8 score is 0   Any dizziness when standing up? Has had some episodes of   Mobilization and Functional losses from last year to this year?   Sleep pattern changes; ok    Advanced Directive addressed; Completed or educated    Health Maintenance Colonoscopy; 10/2000;  Will consider the colo-guard  EKG: completed today   Will postpone prevnar in lieu of evaluation today   Prostate cancer screening: deferred to Dr. HYong Channel   Patient Care Team: [place]  Brayton Mars, MD as PCP - General (Family Medicine) Dr. Allyn Kenner in [place]    Cardiac Risk Factors include: advanced age (>46mn, >>43women)     Objective:    Vitals: BP (!) 160/76   Pulse (!) 50   Temp 97.7 F (36.5 C)   Ht '6\' 1"'  (1.854 m)   Wt 223 lb 7 oz (101.4 kg)   BMI 29.48 kg/m   Body mass index is 29.48 kg/m.  Tobacco History  Smoking Status  . Never Smoker  Smokeless Tobacco  . Never Used     Counseling given: Yes   Past Medical History:  Diagnosis Date  . Elevated PSA    with prostatitis  . Gilbert's syndrome   . Hemorrhoids, external   .  Hyperlipidemia   . Spermatic cord cyst    Past Surgical History:  Procedure Laterality Date  . HAND SURGERY  2005   "lumps on head"   Family History  Problem Relation Age of Onset  . Cancer Father     spinal 718 . Cancer Son     testicular cancer  . Cancer Paternal Grandfather     spinal 538 . Prostate cancer Maternal Uncle     did not die of   History  Sexual Activity  . Sexual activity: Not on file    Outpatient Encounter Prescriptions as of 02/13/2017  Medication Sig  . aspirin 81 MG tablet Take 81 mg by mouth daily.   No facility-administered encounter medications on file as of 02/13/2017.     Activities of Daily Living In your present state of health, do you have any difficulty performing the following activities: 02/13/2017  Hearing? N  Vision? N  Difficulty concentrating or making decisions? N  Walking or climbing stairs? N  Dressing or bathing? N  Doing errands, shopping? N  Preparing Food and eating ? N  Using the Toilet? N  In the past six months, have you accidently leaked urine? N  Do you have problems with loss of bowel control? N  Managing your Medications? N  Managing your Finances? N  Housekeeping or managing your Housekeeping? N  Some recent data might be hidden    Patient Care Team: HMarin Olp MD as PCP - General (Family Medicine)   Assessment:     Exercise Activities and Dietary recommendations Current Exercise Habits: Home exercise routine, Time (Minutes): 60, Frequency (Times/Week): 4 (summer related ), Weekly Exercise (Minutes/Week): 240  Goals    . Exercise 150 minutes per week (moderate activity)          Check out the silver sneaker program Explore what clubs or activities are available to you [place] Walking will be beneficial  Older adults aged 687or older, who are generally fit and have no health conditions that limit their mobility, should try to be active daily and should do: at least 150 minutes of moderate  aerobic activity such as cycling or walking every week, and  strength exercises on two or more days a week that work all the major muscles (legs, hips, back, abdomen, chest, shoulders and arms).      OR  75 minutes of vigorous aerobic activity such as running or a game of singles tennis every week, and  strength exercises on two or more days a week that work all the major muscles (legs, hips, back, abdomen, chest, shoulders and arms).     OR  a mix of moderate and vigorous aerobic activity  every week. For example, two 30-minute runs, plus 30 minutes of fast walking, equates to 150 minutes of moderate aerobic activity, and  strength exercises on two or more days a week that work all the major muscles (legs, hips, back, abdomen, chest, shoulders and arms).  A rule of thumb is that one minute of vigorous activity provides the same health benefits as two minutes of moderate activity. You should also try to break up long periods of sitting with light activity, as sedentary behaviour is now considered an independent risk factor for ill health, no matter how much exercise you do. Find out why sitting is bad for your health. Older adults at risk of falls, such as people with weak legs, poor balance and some medical conditions, should do exercises to improve balance and co-ordination on at least two days a week. Examples include yoga, tai chi and dancing.        Fall Risk Fall Risk  02/13/2017 02/13/2017 07/12/2015 01/14/2013  Falls in the past year? No No No Yes  Number falls in past yr: - - - 2 or more   Depression Screen PHQ 2/9 Scores 02/13/2017 07/12/2015 01/14/2013  PHQ - 2 Score 0 0 0    Cognitive Function MMSE - Mini Mental State Exam 02/13/2017  Not completed: (No Data)     Ad8 score is 0   Immunization History  Administered Date(s) Administered  . Pneumococcal Polysaccharide-23 01/10/2012  . Tdap 07/01/2014  . Zoster 05/02/2009   Screening Tests Health Maintenance  Topic Date Due  .  COLONOSCOPY  10/08/2010  . PNA vac Low Risk Adult (2 of 2 - PCV13) 01/09/2013  . Hepatitis C Screening  10/11/2098 (Originally [birth year])  . INFLUENZA VACCINE  05/08/2017  . TETANUS/TDAP  07/01/2024      Plan:      PCP Notes  Health Maintenance Agrees to have a  Colo-guard  Will postpone the prevnar today lieu of evaluation  Eye exam is scheduled next Wed.  Abnormal Screens  HR bradycardia; Dr. Yong Channel to follow  No c/o of chest pain etc. States he has "light headedness"  Wife states he was driving and stated "brown sheets coming down over eyes".  Wife states he has had some chest pain but he states it is involving the work that he was doing   Referrals  Discussed AD and will bring him a copy and his wife   Patient concerns; tremors in his head; face;  Thinks he has had these long term but wife states they are worse now;   Nurse Concerns; as stated above Next PCP apt to see today    I have personally reviewed and noted the following in the patient's chart:   . Medical and social history . Use of alcohol, tobacco or illicit drugs  . Current medications and supplements . Functional ability and status . Nutritional status . Physical activity . Advanced directives . List of other physicians . Hospitalizations, surgeries, and ER visits in previous 12 months . Vitals . Screenings to include cognitive, depression, and falls . Referrals and appointments  In addition, I have reviewed and discussed with patient certain preventive protocols, quality metrics, and best practice recommendations. A written personalized care plan for preventive services as well as general preventive health recommendations were provided to patient.     Wynetta Fines, RN  02/13/2017

## 2017-02-13 NOTE — Progress Notes (Signed)
Subjective:  Henry Kirk is a 71 y.o. year old very pleasant male patient who presents for/with See problem oriented charting ROS- denies exertional chest pain, denies dyspnea with exertion, has some aching in days after physical labor in chest at times but not with labor itself. Some lightheadedness . Feels like essential tremor slightly worse  Past Medical History-  Patient Active Problem List   Diagnosis Date Noted  . Essential tremor 07/01/2014    Priority: Medium  . 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 Hyperlipidemia 04/25/2009    Priority: Medium  . Prostate cancer screening 07/01/2014    Priority: Low  . Other malaise and fatigue 04/26/2009    Priority: Low    Medications- reviewed and updated Current Outpatient Prescriptions  Medication Sig Dispense Refill  . aspirin 81 MG tablet Take 81 mg by mouth daily.     No current facility-administered medications for this visit.     Objective: BP (!) 160/76   Pulse (!) 52   Temp 97.7 F (36.5 C) (Oral)   Ht 6\' 1"  (1.854 m)   Wt 223 lb 11.2 oz (101.5 kg)   BMI 29.51 kg/m  Gen: NAD, resting comfortably CV: slightly bradycardic but no murmurs rubs or gallops Lungs: CTAB no crackles, wheeze, rhonchi Ext: no edema Skin: warm, dry  EKG: ventricular bigeminy noted with rate of 63, normal axis, normal intervals, no obvious hypertrophy, no st or t wave changes. No prior for comparison  Results for orders placed or performed in visit on 02/13/17 (from the past 24 hour(s))  TSH     Status: None   Collection Time: 02/13/17 11:39 AM  Result Value Ref Range   TSH 1.28 0.35 - 4.50 uIU/mL  CBC     Status: None   Collection Time: 02/13/17 11:39 AM  Result Value Ref Range   WBC 4.1 4.0 - 10.5 K/uL   RBC 4.94 4.22 - 5.81 Mil/uL   Platelets 278.0 150.0 - 400.0 K/uL   Hemoglobin 15.8 13.0 - 17.0 g/dL   HCT 45.7 39.0 - 52.0 %   MCV 92.6 78.0 - 100.0 fl   MCHC 34.5 30.0 - 36.0 g/dL   RDW 13.6 11.5 - 15.5 %   Comprehensive metabolic panel     Status: Abnormal   Collection Time: 02/13/17 11:39 AM  Result Value Ref Range   Sodium 141 135 - 145 mEq/L   Potassium 4.4 3.5 - 5.1 mEq/L   Chloride 103 96 - 112 mEq/L   CO2 28 19 - 32 mEq/L   Glucose, Bld 89 70 - 99 mg/dL   BUN 12 6 - 23 mg/dL   Creatinine, Ser 1.19 0.40 - 1.50 mg/dL   Total Bilirubin 2.2 (H) 0.2 - 1.2 mg/dL   Alkaline Phosphatase 62 39 - 117 U/L   AST 26 0 - 37 U/L   ALT 17 0 - 53 U/L   Total Protein 6.9 6.0 - 8.3 g/dL   Albumin 4.3 3.5 - 5.2 g/dL   Calcium 9.6 8.4 - 10.5 mg/dL   GFR 63.94 >60.00 mL/min   Assessment/Plan:  Bradycardia - Plan: Ambulatory referral to Cardiology, TSH, CBC, Comprehensive metabolic panel S:  heart rate at at home mainly in 40s at home most of his life he think. On our readings has ranged from 52-64 on prior visits. He has noted over last few months heart rate is down to the 30s. Had been noted at dentist office and by Wynetta Fines, RN today  and states which checking blood pressure had also noted it to be low.  Wife states that he has had intermittent lightheadedness for months- not clear if that was associated when his heart rate was in the 30s. . Denies palpitations with this.  Patient states he has had intermittent aches and pains in upper chest after labor a day or two labor- does not have pain when he is laboring. No exertional chest pain  He doesnot feel ill at all today and has no dizziness even when heart rate was in 30s A/P: EKG done due to bradycardia and shows ventricular bigeminy with rate 63. Not clear if heart rate monitors are accurately picking up his true rate as EKG today shows rate about double what nursing staff detected.   Regardless with heart rate as low as 30s reported in multiple locations, will get cardiology's opinion especially with his dizzy episodes- would ask for their opinion on holter monitor to capture heart rate when he has reported dizziness.   He is in no signs of acute  distress today and do not think he requires emergent follow up at present. Did labs today to make sure no cause such as hypothyroidism  Orders Placed This Encounter  Procedures  . TSH    [place]  . CBC    [place]  . Comprehensive metabolic panel    [place]  . Ambulatory referral to Cardiology    Referral Priority:   Routine    Referral Type:   Consultation    Referral Reason:   Specialty Services Required    Requested Specialty:   Cardiology    Number of Visits Requested:   1   Return precautions advised.  Garret Reddish, MD

## 2017-02-13 NOTE — Patient Instructions (Addendum)
Henry Kirk , Thank you for taking time to come for your Medicare Wellness Visit. I appreciate your ongoing commitment to your health goals. Please review the following plan we discussed and let me know if I can assist you in the future.   Can have a medicare screen for hearing anytime you want   Shingrix is a vaccine for the prevention of Shingles in Adults 50 and older.  If you are on Medicare, you can request a prescription from your doctor to be filled at a pharmacy.  Please check with your benefits regarding applicable copays or out of pocket expenses.  The Shingrix is given in 2 vaccines approx 8 weeks apart. You must receive the 2nd dose prior to 6 months from receipt of the first.    These are the goals we discussed: Goals    . Exercise 150 minutes per week (moderate activity)          Check out the silver sneaker program Explore what clubs or activities are available to you in [place]  Walking will be beneficial  Older adults aged 5 or older, who are generally fit and have no health conditions that limit their mobility, should try to be active daily and should do: at least 150 minutes of moderate aerobic activity such as cycling or walking every week, and  strength exercises on two or more days a week that work all the major muscles (legs, hips, back, abdomen, chest, shoulders and arms).      OR  75 minutes of vigorous aerobic activity such as running or a game of singles tennis every week, and  strength exercises on two or more days a week that work all the major muscles (legs, hips, back, abdomen, chest, shoulders and arms).     OR  a mix of moderate and vigorous aerobic activity every week. For example, two 30-minute runs, plus 30 minutes of fast walking, equates to 150 minutes of moderate aerobic activity, and  strength exercises on two or more days a week that work all the major muscles (legs, hips, back, abdomen, chest, shoulders and arms).  A rule of thumb is  that one minute of vigorous activity provides the same health benefits as two minutes of moderate activity. You should also try to break up long periods of sitting with light activity, as sedentary behaviour is now considered an independent risk factor for ill health, no matter how much exercise you do. Find out why sitting is bad for your health. Older adults at risk of falls, such as people with weak legs, poor balance and some medical conditions, should do exercises to improve balance and co-ordination on at least two days a week. Examples include yoga, tai chi and dancing.         This is a list of the screening recommended for you and due dates:  Health Maintenance  Topic Date Due  . Colon Cancer Screening  10/08/2010  . Pneumonia vaccines (2 of 2 - PCV13) 01/09/2013  .  Hepatitis C: One time screening is recommended by Center for Disease Control  (CDC) for  adults born from [birth year] through 1965.   10/11/2098*  . Flu Shot  05/08/2017  . Tetanus Vaccine  07/01/2024  *Topic was postponed. The date shown is not the original due date.   Summary: Preventive Care for Adults  A healthy lifestyle and preventive care can promote health and wellness. Preventive health guidelines for adults include the following key practices.  Marland Kitchen  A routine yearly physical is a good way to check with your health care provider about your health and preventive screening. It is a chance to share any concerns and updates on your health and to receive a thorough exam.  . Visit your dentist for a routine exam and preventive care every 6 months. Brush your teeth twice a day and floss once a day. Good oral hygiene prevents tooth decay and gum disease.  . The frequency of eye exams is based on your age, health, family medical history, use  of contact lenses, and other factors. Follow your health care provider's ecommendations for frequency of eye exams.  . Eat a healthy diet. Foods like vegetables, fruits, whole  grains, low-fat dairy products, and lean protein foods contain the nutrients you need without too many calories. Decrease your intake of foods high in solid fats, added sugars, and salt. Eat the right amount of calories for you. Get information about a proper diet from your health care provider, if necessary.  . Regular physical exercise is one of the most important things you can do for your health. Most adults should get at least 150 minutes of moderate-intensity exercise (any activity that increases your heart rate and causes you to sweat) each week. In addition, most adults need muscle-strengthening exercises on 2 or more days a week.  Silver Sneakers may be a benefit available to you. To determine eligibility, you may visit the website: www.silversneakers.com or contact program at (906)225-6704 Mon-Fri between 8AM-8PM.   . Maintain a healthy weight. The body mass index (BMI) is a screening tool to identify possible weight problems. It provides an estimate of body fat based on height and weight. Your health care provider can find your BMI and can help you achieve or maintain a healthy weight.   For adults 20 years and older: ? A BMI below 18.5 is considered underweight. ? A BMI of 18.5 to 24.9 is normal. ? A BMI of 27 to 28 is considered normal by the Institutes of Health  ? A BMI of 30 and above is considered obese.   . Maintain normal blood lipids and cholesterol levels by exercising and minimizing your intake of saturated fat. Eat a balanced diet with plenty of fruit and vegetables. Blood tests for lipids and cholesterol should begin at age 75 and be repeated every 5 years. If your lipid or cholesterol levels are high, you are over 50, or you are at high risk for heart disease, you may need your cholesterol levels checked more frequently. Ongoing high lipid and cholesterol levels should be treated with medicines if diet and exercise are not working.  . If you smoke, find out from your  health care provider how to quit. If you do not use tobacco, please do not start.  . If you choose to drink alcohol, please do not consume more than one drink for women and 2 for men.  One drink is considered to be 12 ounces (355 mL) of beer, 5 ounces (148 mL) of wine, or 1.5 ounces (44 mL) of liquor. Moderation of alcohol intake to this level decreases your risk of breast cancer and liver damage.   . If you are 59-72 years old, ask your health care provider if you should take aspirin to prevent strokes.  . Use sunscreen. Apply sunscreen liberally and repeatedly throughout the day. You should seek shade when your shadow is shorter than you. Protect yourself by wearing long sleeves, pants, a wide-brimmed hat, and sunglasses  year round, whenever you are outdoors.  . Once a month, do a whole body skin exam, using a mirror to look at the skin on your back. Tell your health care provider of new moles, moles that have irregular borders, moles that are larger than a pencil eraser, or moles that have changed in shape or color.  Last, if you have completed an Advanced Directive; please bring a copy and review with your physician and then we will scan to the medical record    Health Maintenance, Male A healthy lifestyle and preventive care is important for your health and wellness. Ask your health care provider about what schedule of regular examinations is right for you. What should I know about weight and diet?  Eat a Healthy Diet  Eat plenty of vegetables, fruits, whole grains, low-fat dairy products, and lean protein.  Do not eat a lot of foods high in solid fats, added sugars, or salt. Maintain a Healthy Weight  Regular exercise can help you achieve or maintain a healthy weight. You should:  Do at least 150 minutes of exercise each week. The exercise should increase your heart rate and make you sweat (moderate-intensity exercise).  Do strength-training exercises at least twice a  week. Watch Your Levels of Cholesterol and Blood Lipids  Have your blood tested for lipids and cholesterol every 5 years starting at 72 years of age. If you are at high risk for heart disease, you should start having your blood tested when you are 71 years old. You may need to have your cholesterol levels checked more often if:  Your lipid or cholesterol levels are high.  You are older than 72 years of age.  You are at high risk for heart disease. What should I know about cancer screening? Many types of cancers can be detected early and may often be prevented. Lung Cancer  You should be screened every year for lung cancer if:  You are a current smoker who has smoked for at least 30 years.  You are a former smoker who has quit within the past 15 years.  Talk to your health care provider about your screening options, when you should start screening, and how often you should be screened. Colorectal Cancer  Routine colorectal cancer screening usually begins at 72 years of age and should be repeated every 5-10 years until you are 72 years old. You may need to be screened more often if early forms of precancerous polyps or small growths are found. Your health care provider may recommend screening at an earlier age if you have risk factors for colon cancer.  Your health care provider may recommend using home test kits to check for hidden blood in the stool.  A small camera at the end of a tube can be used to examine your colon (sigmoidoscopy or colonoscopy). This checks for the earliest forms of colorectal cancer. Prostate and Testicular Cancer  Depending on your age and overall health, your health care provider may do certain tests to screen for prostate and testicular cancer.  Talk to your health care provider about any symptoms or concerns you have about testicular or prostate cancer. Skin Cancer  Check your skin from head to toe regularly.  Tell your health care provider about any  new moles or changes in moles, especially if:  There is a change in a mole's size, shape, or color.  You have a mole that is larger than a pencil eraser.  Always use sunscreen. Apply sunscreen  liberally and repeat throughout the day.  Protect yourself by wearing long sleeves, pants, a wide-brimmed hat, and sunglasses when outside. What should I know about heart disease, diabetes, and high blood pressure?  If you are 35-35 years of age, have your blood pressure checked every 3-5 years. If you are 40 years of age or older, have your blood pressure checked every year. You should have your blood pressure measured twice-once when you are at a hospital or clinic, and once when you are not at a hospital or clinic. Record the average of the two measurements. To check your blood pressure when you are not at a hospital or clinic, you can use:  An automated blood pressure machine at a pharmacy.  A home blood pressure monitor.  Talk to your health care provider about your target blood pressure.  If you are between 14-59 years old, ask your health care provider if you should take aspirin to prevent heart disease.  Have regular diabetes screenings by checking your fasting blood sugar level.  If you are at a normal weight and have a low risk for diabetes, have this test once every three years after the age of 62.  If you are overweight and have a high risk for diabetes, consider being tested at a younger age or more often.  A one-time screening for abdominal aortic aneurysm (AAA) by ultrasound is recommended for men aged 65-75 years who are current or former smokers. What should I know about preventing infection? Hepatitis B  If you have a higher risk for hepatitis B, you should be screened for this virus. Talk with your health care provider to find out if you are at risk for hepatitis B infection. Hepatitis C  Blood testing is recommended for:  Everyone born from [birth year] through 1966.  Anyone with  known risk factors for hepatitis C. Sexually Transmitted Diseases (STDs)  You should be screened each year for STDs including gonorrhea and chlamydia if:  You are sexually active and are younger than 71 years of age.  You are older than 72 years of age and your health care provider tells you that you are at risk for this type of infection.  Your sexual activity has changed since you were last screened and you are at an increased risk for chlamydia or gonorrhea. Ask your health care provider if you are at risk.  Talk with your health care provider about whether you are at high risk of being infected with HIV. Your health care provider may recommend a prescription medicine to help prevent HIV infection. What else can I do?  Schedule regular health, dental, and eye exams.  Stay current with your vaccines (immunizations).  Do not use any tobacco products, such as cigarettes, chewing tobacco, and e-cigarettes. If you need help quitting, ask your health care provider.  Limit alcohol intake to no more than 2 drinks per day. One drink equals 12 ounces of beer, 5 ounces of wine, or 1 ounces of hard liquor.  Do not use street drugs.  Do not share needles.  Ask your health care provider for help if you need support or information about quitting drugs.  Tell your health care provider if you often feel depressed.  Tell your health care provider if you have ever been abused or do not feel safe at home. This information is not intended to replace advice given to you by your health care provider. Make sure you discuss any questions you have with your health care  provider. Document Released: 03/22/2008 Document Revised: 05/23/2016 Document Reviewed: 06/28/2015 Elsevier Interactive Patient Education  2017 [place] Prevention in the Home Falls can cause injuries and can affect people from all age groups. There are many simple things that you can do to make your home safe and to  help prevent falls. What can I do on the outside of my home?  Regularly repair the edges of walkways and driveways and fix any cracks.  Remove high doorway thresholds.  Trim any shrubbery on the main path into your home.  Use bright outdoor lighting.  Clear walkways of debris and clutter, including tools and rocks.  Regularly check that handrails are securely fastened and in good repair. Both sides of any steps should have handrails.  Install guardrails along the edges of any raised decks or porches.  Have leaves, snow, and ice cleared regularly.  Use sand or salt on walkways during winter months.  In the garage, clean up any spills right away, including grease or oil spills. What can I do in the bathroom?  Use night lights.  Install grab bars by the toilet and in the tub and shower. Do not use towel bars as grab bars.  Use non-skid mats or decals on the floor of the tub or shower.  If you need to sit down while you are in the shower, use a plastic, non-slip stool.  Keep the floor dry. Immediately clean up any water that spills on the floor.  Remove soap buildup in the tub or shower on a regular basis.  Attach bath mats securely with double-sided non-slip rug tape.  Remove throw rugs and other tripping hazards from the floor. What can I do in the bedroom?  Use night lights.  Make sure that a bedside light is easy to reach.  Do not use oversized bedding that drapes onto the floor.  Have a firm chair that has side arms to use for getting dressed.  Remove throw rugs and other tripping hazards from the floor. What can I do in the kitchen?  Clean up any spills right away.  Avoid walking on wet floors.  Place frequently used items in easy-to-reach places.  If you need to reach for something above you, use a sturdy step stool that has a grab bar.  Keep electrical cables out of the way.  Do not use floor polish or wax that makes floors slippery. If you have to  use wax, make sure that it is non-skid floor wax.  Remove throw rugs and other tripping hazards from the floor. What can I do in the stairways?  Do not leave any items on the stairs.  Make sure that there are handrails on both sides of the stairs. Fix handrails that are broken or loose. Make sure that handrails are as long as the stairways.  Check any carpeting to make sure that it is firmly attached to the stairs. Fix any carpet that is loose or worn.  Avoid having throw rugs at the top or bottom of stairways, or secure the rugs with carpet tape to prevent them from moving.  Make sure that you have a light switch at the top of the stairs and the bottom of the stairs. If you do not have them, have them installed. What are some other fall prevention tips?  Wear closed-toe shoes that fit well and support your feet. Wear shoes that have rubber soles or low heels.  When you use a stepladder, make  sure that it is completely opened and that the sides are firmly locked. Have someone hold the ladder while you are using it. Do not climb a closed stepladder.  Add color or contrast paint or tape to grab bars and handrails in your home. Place contrasting color strips on the first and last steps.  Use mobility aids as needed, such as canes, walkers, scooters, and crutches.  Turn on lights if it is dark. Replace any light bulbs that burn out.  Set up furniture so that there are clear paths. Keep the furniture in the same spot.  Fix any uneven floor surfaces.  Choose a carpet design that does not hide the edge of steps of a stairway.  Be aware of any and all pets.  Review your medicines with your healthcare provider. Some medicines can cause dizziness or changes in blood pressure, which increase your risk of falling. Talk with your health care provider about other ways that you can decrease your risk of falls. This may include working with a physical therapist or trainer to improve your strength,  balance, and endurance. This information is not intended to replace advice given to you by your health care provider. Make sure you discuss any questions you have with your health care provider. Document Released: 09/14/2002 Document Revised: 02/21/2016 Document Reviewed: 10/29/2014 Elsevier Interactive Patient Education  2017 [employer].

## 2017-02-13 NOTE — Patient Instructions (Signed)
We will call you within a week about your referral to cardiology. If you do not hear  By next week, give [place] a call.   Please stop by lab before you go  Please seek care immediately if you have new or worsening symptoms- particularly the dizziness or new chest pain or shortness of breath  I want to see you back for a regular follow up within a few weeks of your cardiology visit

## 2017-02-14 ENCOUNTER — Telehealth: Payer: Self-pay | Admitting: Family Medicine

## 2017-02-14 NOTE — Telephone Encounter (Signed)
Called patient back with his lab results.

## 2017-02-14 NOTE — Telephone Encounter (Signed)
Pt returned your call.  

## 2017-02-15 ENCOUNTER — Telehealth: Payer: Self-pay

## 2017-02-15 NOTE — Telephone Encounter (Signed)
Order faxed for cologuard to [facility]; Receipt confirmed Call to Ms. Kelton Pillar to let her know they should receive the test or a call and to let me know if they do not hear from anyone over the next 10 days.  Also left reminder to take his Prevnar.  Will send out AD as they did not get a copy while in the office as we had discussed. Wynetta Fines RN

## 2017-02-18 ENCOUNTER — Encounter: Payer: Self-pay | Admitting: Cardiovascular Disease

## 2017-02-18 ENCOUNTER — Ambulatory Visit (INDEPENDENT_AMBULATORY_CARE_PROVIDER_SITE_OTHER): Payer: Medicare Other | Admitting: Cardiovascular Disease

## 2017-02-18 VITALS — BP 150/94 | HR 52 | Ht 73.0 in | Wt 224.0 lb

## 2017-02-18 DIAGNOSIS — I10 Essential (primary) hypertension: Secondary | ICD-10-CM | POA: Diagnosis not present

## 2017-02-18 DIAGNOSIS — R42 Dizziness and giddiness: Secondary | ICD-10-CM

## 2017-02-18 DIAGNOSIS — E785 Hyperlipidemia, unspecified: Secondary | ICD-10-CM | POA: Diagnosis not present

## 2017-02-18 DIAGNOSIS — Z8679 Personal history of other diseases of the circulatory system: Secondary | ICD-10-CM | POA: Diagnosis not present

## 2017-02-18 DIAGNOSIS — R011 Cardiac murmur, unspecified: Secondary | ICD-10-CM | POA: Diagnosis not present

## 2017-02-18 DIAGNOSIS — G4730 Sleep apnea, unspecified: Secondary | ICD-10-CM | POA: Diagnosis not present

## 2017-02-18 DIAGNOSIS — Z79899 Other long term (current) drug therapy: Secondary | ICD-10-CM

## 2017-02-18 NOTE — Progress Notes (Signed)
Cardiology Office Note    Date:  02/20/2017   ID:  Cartel, Mauss 01-25-1946, MRN [id number]  PCP:  Marin Olp, MD  Cardiologist:  Shelva Majestic, MD   Chief Complaint  Patient presents with  . New Evaluation    pt c/o low heart rate, slight dizziness    History of Present Illness:  Henry Kirk is a 71 y.o. male who is referred for cardiology consultation through the courtesy of Dr. Rushie Chestnut for evaluation of bradycardia and occasional lightheadedness.  Henry Kirk denies any awareness of known coronary artery disease.  He states his heart rate has always been slow.  He has been evaluated recently by Dr. Yong Channel who has noticed bradycardia on a fairly consistent basis.  Patient has also been found to have heart rates dropping down into the upper 30s and usually was not symptomatic.  When last seen by Rushie Chestnut, there was evidence for ventricular bigeminy with rates in the 60s.  Most of the time, he is unaware of any palpitations.  There've been rare episodes of lightheadedness with walking in the morning.  He is now referred for consideration of possible monitoring of his heart rhythm.  Upon review of the patient's medical records, he has been noted to have mild increase in total bilirubin.  His transaminases have been normal.  He denies any definitive chest pain episodes.  At times there are episodes of mild shortness of breath with activity.  He states that his sleep is poor and he has had witnessed apneic spells.  Nocturia at least 3 times per night.  He denies PND, orthopnea.  He is unaware of nocturnal palpitations.  He presents for evaluation.   Past Medical History:  Diagnosis Date  . Elevated PSA    with prostatitis  . Gilbert's syndrome   . Hemorrhoids, external   . Hyperlipidemia   . Spermatic cord cyst     Past Surgical History:  Procedure Laterality Date  . HAND SURGERY  2005   "lumps on head"    Current Medications: Outpatient  Medications Prior to Visit  Medication Sig Dispense Refill  . aspirin 81 MG tablet Take 81 mg by mouth daily.     No facility-administered medications prior to visit.      Allergies:   Patient has no known allergies.   Social History   Social History  . Marital status: Married    Spouse name: N/A  . Number of children: N/A  . Years of education: N/A   Occupational History  . electrician Engineer, production [employer]   Social History Main Topics  . Smoking status: Never Smoker  . Smokeless tobacco: Never Used  . Alcohol use No  . Drug use: No  . Sexual activity: Not Asked   Other Topics Concern  . None   Social History Narrative   Married 1966. 2 sons Randall Hiss, not married and no kids, and Larkin Ina with 1 daughter, not married. All children live at home.       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       Hobbies: yardwork, working at [employer].     Additional social history is notable that he was born in [place], [place].  He is married for 51 years.  He has 2 children.  He is retired and previously was an Clinical biochemist for [employer].  He completed 12th grade of education.  There is no tobacco history.  Family  History:  The patient's family history includes Cancer in his father, paternal grandfather, and son; Prostate cancer in his maternal uncle.  Both parents are deceased.  His mother died at 29 years old.  His father had cancer and was a smoker and died at 8.  He is one living brother age 34.  He has 2 children, ages 81 and 67, and his 15 year old child had testicular cancer.  ROS General: Negative; No fevers, chills, or night sweats;  HEENT: Negative; No changes in vision or hearing, sinus congestion, difficulty swallowing Pulmonary: Negative; No cough, wheezing, shortness of breath, hemoptysis Cardiovascular:  See HPI GI: Negative; No nausea, vomiting, diarrhea, or abdominal pain GU: Negative; No dysuria, hematuria, or  difficulty voiding Musculoskeletal: Negative; no myalgias, joint pain, or weakness Hematologic/Oncology: Negative; no easy bruising, bleeding Endocrine: Negative; no heat/cold intolerance; no diabetes Neuro: Negative; no changes in balance, headaches Skin: Negative; No rashes or skin lesions Psychiatric: Negative; No behavioral problems, depression Sleep: Positive for  snoring, and witnessed apnea ; no daytime sleepiness, hypersomnolence, bruxism, restless legs, hypnogognic hallucinations, no cataplexy Other comprehensive 14 point system review is negative.   PHYSICAL EXAM:   VS:  BP (!) 150/94 (BP Location: Left Arm, Patient Position: Sitting, Cuff Size: Normal)   Pulse (!) 52   Ht 6' 1" (1.854 m)   Wt 224 lb (101.6 kg)   BMI 29.55 kg/m    Wt Readings from Last 3 Encounters:  02/18/17 224 lb (101.6 kg)  02/13/17 223 lb 11.2 oz (101.5 kg)  02/13/17 223 lb 7 oz (101.4 kg)    General: Alert, oriented, no distress.  Skin: normal turgor, no rashes, warm and dry HEENT: Normocephalic, atraumatic. Pupils equal round and reactive to light; sclera anicteric; extraocular muscles intact; Fundi No hemorrhages or exudates; there appears to be probable early bilateral cataracts. Nose without nasal septal hypertrophy Mouth/Parynx benign; Mallinpatti scale 3 Neck: No JVD, no carotid bruits; normal carotid upstroke Lungs: clear to ausculatation and percussion; no wheezing or rales Chest wall: without tenderness to palpitation Heart: PMI not displaced, RRR, s1 s2 normal, 1/6 systolic murmur, no diastolic murmur, no rubs, gallops, thrills, or heaves Abdomen: soft, nontender; no hepatosplenomehaly, BS+; abdominal aorta nontender and not dilated by palpation. Back: no CVA tenderness Pulses 2+ Musculoskeletal: full range of motion, normal strength, no joint deformities Extremities: no clubbing cyanosis or edema, Homan's sign negative  Neurologic: grossly nonfocal; Cranial nerves grossly  wnl Psychologic: Normal mood and affect   Studies/Labs Reviewed:   EKG:  EKG is ordered today.  ECG (independently read by me): Sinus bradycardia with occasional to frequent PVCs with retrograde P wave.  Ventricular rate 53.  Left axis deviation.  PR interval 18 ms.  QTc interval 401 ms.  Recent Labs: BMP Latest Ref Rng & Units 02/13/2017 07/12/2015 07/01/2014  Glucose 70 - 99 mg/dL 89 78 82  BUN 6 - 23 mg/dL _0 Creatinine 0.40 - 1.50 mg/dL 1.19 1.09 1.2  Sodium 135 - 145 mEq/L 141 143 140  Potassium 3.5 - 5.1 mEq/L 4.4 4.4 4.6  Chloride 96 - 112 mEq/L 103 104 102  CO2 19 - 32 mEq/L 28 31 34(H)  Calcium 8.4 - 10.5 mg/dL 9.6 9.7 9.4     Hepatic Function Latest Ref Rng & Units 02/19/2017 02/13/2017 07/12/2015  Total Protein 6.0 - 8.3 g/dL - 6.9 7.0  Albumin 3.5 - 5.2 g/dL - 4.3 4.1  AST 0 - 37 U/L - 26 25  ALT 0 -  53 U/L - 17 19  Alk Phosphatase 39 - 117 U/L - 62 65  Total Bilirubin 0.0 - 1.2 mg/dL 1.8(H) 2.2(H) 1.6(H)  Bilirubin, Direct 0.00 - 0.40 mg/dL 0.31 - -    CBC Latest Ref Rng & Units 02/13/2017 07/12/2015 01/14/2013  WBC 4.0 - 10.5 K/uL 4.1 6.1 4.9  Hemoglobin 13.0 - 17.0 g/dL 15.8 15.6 15.1  Hematocrit 39.0 - 52.0 % 45.7 46.3 44.2  Platelets 150.0 - 400.0 K/uL 278.0 257.0 267.0   Lab Results  Component Value Date   MCV 92.6 02/13/2017   MCV 93.6 07/12/2015   MCV 93.5 01/14/2013   Lab Results  Component Value Date   TSH 1.28 02/13/2017   No results found for: HGBA1C   BNP No results found for: BNP  ProBNP No results found for: PROBNP   Lipid Panel     Component Value Date/Time   CHOL 228 (H) 02/19/2017 0919   TRIG 69 02/19/2017 0919   HDL 43 02/19/2017 0919   CHOLHDL 5.3 (H) 02/19/2017 0919   CHOLHDL 5 07/12/2015 0922   VLDL 18.8 07/12/2015 0922   LDLCALC 171 (H) 02/19/2017 0919   LDLDIRECT 177.5 01/14/2013 0918     RADIOLOGY: No results found.   Additional studies/ records that were reviewed today include:  I reviewed the patient's  medical records and recent evaluation from Garret Reddish, M.D.    ASSESSMENT:    1. History of sinus bradycardia   2. Episodic lightheadedness   3. Hyperlipidemia, unspecified hyperlipidemia type   4. Heart murmur, systolic   5. Gilbert's syndrome   6. Medication management   7. Sleep apnea, unspecified type   8. Essential hypertension      PLAN:  Henry Kirk is a very pleasant 71 year old gentleman who is a retired Clinical biochemist and previously had worked at [employer].  He is remaining active.  Most of his life, but does not routinely exercise.  Recently, he has been noted to have episodes of bradycardia and ventricular bigeminy.  He reportedly was also noted to have heart rates in the upper 30s.  He has experienced some mild episodes of lightheadedness particularly with walking.  He is not on any medications to slow his heart rate.  Is possible me he may have some chronotropic incompetence and lightheadedness may be resulting from his inability to increase his heart rate appropriately.  He has a 1/6 systolic murmur.  I'm scheduling him for an echo Doppler study to evaluate for structural heart disease and particularly looking at systolic and diastolic function and valvular architecture.  I am scheduling him to wear a two-week event monitor to further evaluate potential arrhythmia.  He currently is not on any medications.  I will schedule him to undergo a exercise Myoview study to make certain there is no potential for underlying ischemia which may be silent, but contributing to this abnormality.  Review of past laboratory demonstrates significant hyperlipidemia which certainly may have contributed to atherosclerosis.  If he is significantly bradycardic, he may be even further bradycardic while sleeping and it would be very helpful to make certain he does not have obstructive sleep apnea.  According to his wife, she has witnessed apneic spells and he admits to frequent nocturia.  He has  had persistent hyper bilirubinemia.  I suspect he has Gilbert's syndrome and will obtain direct and indirect bilirubin levels for further evaluation.  His liver transaminases have been normal.  In 2016, he had significant hyperlipidemia and is not on any  therapy.  I am recommending repeat lipid studies be obtained anticipate initiation of therapy if lipid studies remain elevated.  He will need to monitor his blood pressure which was elevated today.  I will not start him on treatment today, but will reassess and anticipate this will need to be done  I will see him in follow-up of the above studies and further recommendations will made at that time.  Medication Adjustments/Labs and Tests Ordered: Current medicines are reviewed at length with the patient today.  Concerns regarding medicines are outlined above.  Medication changes, Labs and Tests ordered today are listed in the Patient Instructions below. Patient Instructions  Medication Instructions: No changes  Labwork: Please have the following fasting drawn: Lipids and Indirect/Direct Bilirubin  Procedures/Testing: Your physician has requested that you have an echocardiogram. Echocardiography is a painless test that uses sound waves to create images of your heart. It provides your doctor with information about the size and shape of your heart and how well your heart's chambers and valves are working. This procedure takes approximately one hour. There are no restrictions for this procedure. This will be done at [address].  Your physician has requested that you have en exercise stress myoview. For further information please visit [email or web address]. Please follow instruction sheet, as given.  Your physician has recommended that you have a sleep study. This test records several body functions during sleep, including: brain activity, eye movement, oxygen and carbon dioxide blood levels, heart rate and rhythm, breathing rate and rhythm,  the flow of air through your mouth and nose, snoring, body muscle movements, and chest and belly movement.  Your physician has recommended that you wear a 2 week event monitor. Event monitors are medical devices that record the heart's electrical activity. Doctors most often [place] these monitors to diagnose arrhythmias. Arrhythmias are problems with the speed or rhythm of the heartbeat. The monitor is a small, portable device. You can wear one while you do your normal daily activities. This is usually used to diagnose what is causing palpitations/syncope (passing out).    Follow-Up: Please follow up with Dr. Claiborne Billings after the testing.    If you need a refill on your cardiac medications before your next appointment, please call your pharmacy.      Signed, Shelva Majestic, MD  02/20/2017 7:54 PM    [facility] [address], [place], [place], [place]  [address] Phone: [phone]

## 2017-02-18 NOTE — Patient Instructions (Addendum)
Medication Instructions: No changes  Labwork: Please have the following fasting drawn: Lipids and Indirect/Direct Bilirubin  Procedures/Testing: Your physician has requested that you have an echocardiogram. Echocardiography is a painless test that uses sound waves to create images of your heart. It provides your doctor with information about the size and shape of your heart and how well your heart's chambers and valves are working. This procedure takes approximately one hour. There are no restrictions for this procedure. This will be done at [address].  Your physician has requested that you have en exercise stress myoview. For further information please visit [email or web address]. Please follow instruction sheet, as given.  Your physician has recommended that you have a sleep study. This test records several body functions during sleep, including: brain activity, eye movement, oxygen and carbon dioxide blood levels, heart rate and rhythm, breathing rate and rhythm, the flow of air through your mouth and nose, snoring, body muscle movements, and chest and belly movement.  Your physician has recommended that you wear a 2 week event monitor. Event monitors are medical devices that record the heart's electrical activity. Doctors most often [place] these monitors to diagnose arrhythmias. Arrhythmias are problems with the speed or rhythm of the heartbeat. The monitor is a small, portable device. You can wear one while you do your normal daily activities. This is usually used to diagnose what is causing palpitations/syncope (passing out).    Follow-Up: Please follow up with Dr. Claiborne Billings after the testing.    If you need a refill on your cardiac medications before your next appointment, please call your pharmacy.

## 2017-02-19 DIAGNOSIS — Z79899 Other long term (current) drug therapy: Secondary | ICD-10-CM | POA: Diagnosis not present

## 2017-02-19 DIAGNOSIS — E785 Hyperlipidemia, unspecified: Secondary | ICD-10-CM | POA: Diagnosis not present

## 2017-02-20 ENCOUNTER — Encounter: Payer: Self-pay | Admitting: Cardiovascular Disease

## 2017-02-20 ENCOUNTER — Telehealth (HOSPITAL_COMMUNITY): Payer: Self-pay

## 2017-02-20 DIAGNOSIS — D3132 Benign neoplasm of left choroid: Secondary | ICD-10-CM | POA: Diagnosis not present

## 2017-02-20 LAB — LIPID PANEL
CHOL/HDL RATIO: 5.3 ratio — AB (ref 0.0–5.0)
Cholesterol, Total: 228 mg/dL — ABNORMAL HIGH (ref 100–199)
HDL: 43 mg/dL (ref >39)
LDL Calculated: 171 mg/dL — ABNORMAL HIGH (ref 0–99)
Triglycerides: 69 mg/dL (ref 0–149)
VLDL Cholesterol Cal: 14 mg/dL (ref 5–40)

## 2017-02-20 LAB — BILIRUBIN, FRACTIONATED(TOT/DIR/INDIR)
BILIRUBIN TOTAL: 1.8 mg/dL — AB (ref 0.0–1.2)
Bilirubin, Direct: 0.31 mg/dL (ref 0.00–0.40)
Bilirubin, Indirect: 1.49 mg/dL — ABNORMAL HIGH (ref 0.10–0.80)

## 2017-02-20 NOTE — Telephone Encounter (Signed)
Encounter complete. 

## 2017-02-22 ENCOUNTER — Ambulatory Visit (HOSPITAL_COMMUNITY)
Admission: RE | Admit: 2017-02-22 | Discharge: 2017-02-22 | Disposition: A | Discharge status: Home / Self Care | Payer: Medicare Other | Source: Ambulatory Visit | Attending: Internal Medicine | Admitting: Internal Medicine

## 2017-02-22 DIAGNOSIS — R011 Cardiac murmur, unspecified: Secondary | ICD-10-CM | POA: Diagnosis not present

## 2017-02-22 LAB — MYOCARDIAL PERFUSION IMAGING
CHL CUP MPHR: 149 {beats}/min
CHL CUP NUCLEAR SSS: 1
CHL CUP RESTING HR STRESS: 48 {beats}/min
CSEPED: 6 min
CSEPEDS: 1 s
CSEPEW: 7 METS
CSEPHR: 91 %
CSEPPHR: 136 {beats}/min
RPE: 18
SDS: 1
SRS: 0
TID: 1.14

## 2017-02-22 MED ORDER — TECHNETIUM TC 99M TETROFOSMIN IV KIT
10.2000 | PACK | Freq: Once | INTRAVENOUS | Status: AC | PRN
  Administered 2017-02-22: 10.2 via INTRAVENOUS
  Filled 2017-02-22: qty 11

## 2017-02-22 MED ORDER — TECHNETIUM TC 99M TETROFOSMIN IV KIT
31.0000 | PACK | Freq: Once | INTRAVENOUS | Status: AC | PRN
Start: 2017-02-22 — End: 2017-02-22
  Administered 2017-02-22: 31 via INTRAVENOUS
  Filled 2017-02-22: qty 31

## 2017-02-27 ENCOUNTER — Ambulatory Visit (INDEPENDENT_AMBULATORY_CARE_PROVIDER_SITE_OTHER): Payer: Medicare Other

## 2017-02-27 ENCOUNTER — Other Ambulatory Visit: Payer: Self-pay | Admitting: Cardiovascular Disease

## 2017-02-27 DIAGNOSIS — R011 Cardiac murmur, unspecified: Secondary | ICD-10-CM

## 2017-02-27 DIAGNOSIS — R42 Dizziness and giddiness: Secondary | ICD-10-CM

## 2017-02-27 DIAGNOSIS — I493 Ventricular premature depolarization: Secondary | ICD-10-CM

## 2017-02-27 DIAGNOSIS — R001 Bradycardia, unspecified: Secondary | ICD-10-CM | POA: Diagnosis not present

## 2017-02-28 ENCOUNTER — Ambulatory Visit (HOSPITAL_COMMUNITY): Payer: Medicare Other | Attending: Cardiovascular Disease

## 2017-02-28 ENCOUNTER — Other Ambulatory Visit: Payer: Self-pay

## 2017-02-28 DIAGNOSIS — I071 Rheumatic tricuspid insufficiency: Secondary | ICD-10-CM | POA: Insufficient documentation

## 2017-02-28 DIAGNOSIS — R011 Cardiac murmur, unspecified: Secondary | ICD-10-CM | POA: Diagnosis not present

## 2017-02-28 DIAGNOSIS — I5030 Unspecified diastolic (congestive) heart failure: Secondary | ICD-10-CM | POA: Insufficient documentation

## 2017-02-28 DIAGNOSIS — I517 Cardiomegaly: Secondary | ICD-10-CM | POA: Insufficient documentation

## 2017-03-06 ENCOUNTER — Telehealth: Payer: Self-pay | Admitting: Cardiovascular Disease

## 2017-03-06 ENCOUNTER — Other Ambulatory Visit: Payer: Self-pay

## 2017-03-06 DIAGNOSIS — E785 Hyperlipidemia, unspecified: Secondary | ICD-10-CM

## 2017-03-06 MED ORDER — ROSUVASTATIN CALCIUM 20 MG PO TABS: 20.0000 mg | ORAL_TABLET | Freq: Every day | ORAL | 6 refills | Status: DC

## 2017-03-06 NOTE — Telephone Encounter (Signed)
Returned call to patient.Myoview and lab results given.

## 2017-03-06 NOTE — Telephone Encounter (Signed)
Patient states that he is returning a missed call, thanks.

## 2017-04-08 DIAGNOSIS — Z1283 Encounter for screening for malignant neoplasm of skin: Secondary | ICD-10-CM | POA: Diagnosis not present

## 2017-04-17 ENCOUNTER — Encounter: Payer: Self-pay | Admitting: Family Medicine

## 2017-04-17 DIAGNOSIS — Z1211 Encounter for screening for malignant neoplasm of colon: Secondary | ICD-10-CM | POA: Diagnosis not present

## 2017-04-17 DIAGNOSIS — Z1212 Encounter for screening for malignant neoplasm of rectum: Secondary | ICD-10-CM | POA: Diagnosis not present

## 2017-04-17 LAB — HM COLONOSCOPY

## 2017-04-17 LAB — COLOGUARD: Cologuard: NEGATIVE

## 2017-04-18 ENCOUNTER — Encounter (HOSPITAL_BASED_OUTPATIENT_CLINIC_OR_DEPARTMENT_OTHER): Payer: Medicare Other

## 2017-04-26 ENCOUNTER — Encounter: Payer: Self-pay | Admitting: Family Medicine

## 2017-05-06 DIAGNOSIS — E785 Hyperlipidemia, unspecified: Secondary | ICD-10-CM | POA: Diagnosis not present

## 2017-05-13 DIAGNOSIS — H40013 Open angle with borderline findings, low risk, bilateral: Secondary | ICD-10-CM | POA: Diagnosis not present

## 2017-05-13 DIAGNOSIS — H25812 Combined forms of age-related cataract, left eye: Secondary | ICD-10-CM | POA: Diagnosis not present

## 2017-05-13 DIAGNOSIS — H40011 Open angle with borderline findings, low risk, right eye: Secondary | ICD-10-CM | POA: Diagnosis not present

## 2017-05-13 DIAGNOSIS — H25813 Combined forms of age-related cataract, bilateral: Secondary | ICD-10-CM | POA: Diagnosis not present

## 2017-05-13 DIAGNOSIS — D3132 Benign neoplasm of left choroid: Secondary | ICD-10-CM | POA: Diagnosis not present

## 2017-05-22 LAB — HEPATIC FUNCTION PANEL
ALBUMIN: 4.3 g/dL (ref 3.5–4.8)
ALK PHOS: 74 IU/L (ref 39–117)
ALT: 22 IU/L (ref 0–44)
AST: 28 IU/L (ref 0–40)
BILIRUBIN, DIRECT: 0.29 mg/dL (ref 0.00–0.40)
Bilirubin Total: 1.2 mg/dL (ref 0.0–1.2)
Total Protein: 6.8 g/dL (ref 6.0–8.5)

## 2017-05-22 LAB — LIPID PANEL
Chol/HDL Ratio: 3.2 ratio (ref 0.0–5.0)
Cholesterol, Total: 142 mg/dL (ref 100–199)
HDL: 45 mg/dL (ref >39)
LDL Calculated: 82 mg/dL (ref 0–99)
TRIGLYCERIDES: 75 mg/dL (ref 0–149)
VLDL Cholesterol Cal: 15 mg/dL (ref 5–40)

## 2017-05-22 LAB — SPECIMEN STATUS REPORT

## 2017-05-29 ENCOUNTER — Encounter (HOSPITAL_COMMUNITY): Payer: Self-pay

## 2017-05-29 ENCOUNTER — Encounter (HOSPITAL_COMMUNITY)
Admission: RE | Admit: 2017-05-29 | Discharge: 2017-05-29 | Disposition: A | Discharge status: Home / Self Care | Payer: Medicare Other | Source: Ambulatory Visit | Attending: Ophthalmology | Admitting: Ophthalmology

## 2017-05-29 NOTE — Patient Instructions (Signed)
Your procedure is scheduled on: 06/03/2017   Report to [facility] at  640   AM.  Call this number if you have problems the morning of surgery: [phone]   Do not eat food or drink liquids :After Midnight.      Take these medicines the morning of surgery with A SIP OF WATER: none   Do not wear jewelry, make-up or nail polish.  Do not wear lotions, powders, or perfumes. You may wear deodorant.  Do not shave 48 hours prior to surgery.  Do not bring valuables to the hospital.  Contacts, dentures or bridgework may not be worn into surgery.  Leave suitcase in the car. After surgery it may be brought to your room.  For patients admitted to the hospital, checkout time is 11:00 AM the day of discharge.   Patients discharged the day of surgery will not be allowed to drive home.  :     Please read over the following fact sheets that you were given: Coughing and Deep Breathing, Surgical Site Infection Prevention, Anesthesia Post-op Instructions and Care and Recovery After Surgery    Cataract A cataract is a clouding of the lens of the eye. When a lens becomes cloudy, vision is reduced based on the degree and nature of the clouding. Many cataracts reduce vision to some degree. Some cataracts make people more near-sighted as they develop. Other cataracts increase glare. Cataracts that are ignored and become worse can sometimes look white. The white color can be seen through the pupil. CAUSES   Aging. However, cataracts may occur at any age, even in newborns.   Certain drugs.   Trauma to the eye.   Certain diseases such as diabetes.   Specific eye diseases such as chronic inflammation inside the eye or a sudden attack of a rare form of glaucoma.   Inherited or acquired medical problems.  SYMPTOMS   Gradual, progressive drop in vision in the affected eye.   Severe, rapid visual loss. This most often happens when trauma is the cause.  DIAGNOSIS  To detect a cataract, an eye doctor examines  the lens. Cataracts are best diagnosed with an exam of the eyes with the pupils enlarged (dilated) by drops.  TREATMENT  For an early cataract, vision may improve by using different eyeglasses or stronger lighting. If that does not help your vision, surgery is the only effective treatment. A cataract needs to be surgically removed when vision loss interferes with your everyday activities, such as driving, reading, or watching TV. A cataract may also have to be removed if it prevents examination or treatment of another eye problem. Surgery removes the cloudy lens and usually replaces it with a substitute lens (intraocular lens, IOL).  At a time when both you and your doctor agree, the cataract will be surgically removed. If you have cataracts in both eyes, only one is usually removed at a time. This allows the operated eye to heal and be out of danger from any possible problems after surgery (such as infection or poor wound healing). In rare cases, a cataract may be doing damage to your eye. In these cases, your caregiver may advise surgical removal right away. The vast majority of people who have cataract surgery have better vision afterward. HOME CARE INSTRUCTIONS  If you are not planning surgery, you may be asked to do the following:  Use different eyeglasses.   Use stronger or brighter lighting.   Ask your eye doctor about reducing your medicine  dose or changing medicines if it is thought that a medicine caused your cataract. Changing medicines does not make the cataract go away on its own.   Become familiar with your surroundings. Poor vision can lead to injury. Avoid bumping into things on the affected side. You are at a higher risk for tripping or falling.   Exercise extreme care when driving or operating machinery.   Wear sunglasses if you are sensitive to bright light or experiencing problems with glare.  SEEK IMMEDIATE MEDICAL CARE IF:   You have a worsening or sudden vision loss.    You notice redness, swelling, or increasing pain in the eye.   You have a fever.  Document Released: 09/24/2005 Document Revised: 09/13/2011 Document Reviewed: 05/18/2011 [facility] Patient Information 2012 [place].PATIENT INSTRUCTIONS POST-ANESTHESIA  IMMEDIATELY FOLLOWING SURGERY:  Do not drive or operate machinery for the first twenty four hours after surgery.  Do not make any important decisions for twenty four hours after surgery or while taking narcotic pain medications or sedatives.  If you develop intractable nausea and vomiting or a severe headache please notify your doctor immediately.  FOLLOW-UP:  Please make an appointment with your surgeon as instructed. You do not need to follow up with anesthesia unless specifically instructed to do so.  WOUND CARE INSTRUCTIONS (if applicable):  Keep a dry clean dressing on the anesthesia/puncture wound site if there is drainage.  Once the wound has quit draining you may leave it open to air.  Generally you should leave the bandage intact for twenty four hours unless there is drainage.  If the epidural site drains for more than 36-48 hours please call the anesthesia department.  QUESTIONS?:  Please feel free to call your physician or the hospital operator if you have any questions, and they will be happy to assist you.

## 2017-06-03 ENCOUNTER — Ambulatory Visit (HOSPITAL_COMMUNITY)
Admission: RE | Admit: 2017-06-03 | Discharge: 2017-06-03 | Disposition: A | Discharge status: Home / Self Care | Payer: Medicare Other | Source: Ambulatory Visit | Attending: Ophthalmology | Admitting: Ophthalmology

## 2017-06-03 ENCOUNTER — Ambulatory Visit (HOSPITAL_COMMUNITY): Payer: Medicare Other | Admitting: Anesthesiology

## 2017-06-03 ENCOUNTER — Encounter (HOSPITAL_COMMUNITY): Payer: Self-pay | Admitting: *Deleted

## 2017-06-03 ENCOUNTER — Encounter (HOSPITAL_COMMUNITY)
Admission: RE | Disposition: A | Discharge status: Home / Self Care | Payer: Self-pay | Source: Ambulatory Visit | Attending: Ophthalmology

## 2017-06-03 DIAGNOSIS — Z79899 Other long term (current) drug therapy: Secondary | ICD-10-CM | POA: Insufficient documentation

## 2017-06-03 DIAGNOSIS — H25812 Combined forms of age-related cataract, left eye: Secondary | ICD-10-CM | POA: Insufficient documentation

## 2017-06-03 DIAGNOSIS — H269 Unspecified cataract: Secondary | ICD-10-CM | POA: Diagnosis not present

## 2017-06-03 HISTORY — PX: CATARACT EXTRACTION W/PHACO: SHX586

## 2017-06-03 SURGERY — PHACOEMULSIFICATION, CATARACT, WITH IOL INSERTION
Anesthesia: Monitor Anesthesia Care | Site: Eye | Laterality: Left

## 2017-06-03 MED ORDER — MIDAZOLAM HCL 2 MG/2ML IJ SOLN
INTRAMUSCULAR | Status: AC
  Filled 2017-06-03: qty 2

## 2017-06-03 MED ORDER — MIDAZOLAM HCL 2 MG/2ML IJ SOLN
1.0000 mg | Freq: Once | INTRAMUSCULAR | Status: AC | PRN
  Administered 2017-06-03: 2 mg via INTRAVENOUS

## 2017-06-03 MED ORDER — POVIDONE-IODINE 5 % OP SOLN
OPHTHALMIC | Status: DC | PRN
  Administered 2017-06-03: 1 via OPHTHALMIC

## 2017-06-03 MED ORDER — LACTATED RINGERS IV SOLN: INTRAVENOUS | Status: DC

## 2017-06-03 MED ORDER — BSS IO SOLN
INTRAOCULAR | Status: DC | PRN
  Administered 2017-06-03: 500 mL

## 2017-06-03 MED ORDER — CYCLOPENTOLATE-PHENYLEPHRINE 0.2-1 % OP SOLN
1.0000 [drp] | OPHTHALMIC | Status: AC
  Administered 2017-06-03 (×3): 1 [drp] via OPHTHALMIC

## 2017-06-03 MED ORDER — LIDOCAINE HCL 3.5 % OP GEL
1.0000 "application " | Freq: Once | OPHTHALMIC | Status: AC
  Administered 2017-06-03: 1 via OPHTHALMIC

## 2017-06-03 MED ORDER — NEOMYCIN-POLYMYXIN-DEXAMETH 3.5-10000-0.1 OP SUSP
OPHTHALMIC | Status: DC | PRN
  Administered 2017-06-03: 2 [drp] via OPHTHALMIC

## 2017-06-03 MED ORDER — LIDOCAINE HCL (PF) 1 % IJ SOLN
INTRAMUSCULAR | Status: DC | PRN
  Administered 2017-06-03: .8 mL

## 2017-06-03 MED ORDER — PROVISC 10 MG/ML IO SOLN
INTRAOCULAR | Status: DC | PRN
  Administered 2017-06-03: 0.85 mL via INTRAOCULAR

## 2017-06-03 MED ORDER — PHENYLEPHRINE HCL 2.5 % OP SOLN
1.0000 [drp] | OPHTHALMIC | Status: AC
  Administered 2017-06-03 (×3): 1 [drp] via OPHTHALMIC

## 2017-06-03 MED ORDER — BSS IO SOLN
INTRAOCULAR | Status: DC | PRN
  Administered 2017-06-03: 15 mL

## 2017-06-03 MED ORDER — TETRACAINE HCL 0.5 % OP SOLN
1.0000 [drp] | OPHTHALMIC | Status: AC
  Administered 2017-06-03 (×3): 1 [drp] via OPHTHALMIC

## 2017-06-03 SURGICAL SUPPLY — 10 items
CLOTH BEACON ORANGE TIMEOUT ST (SAFETY) ×3 IMPLANT
EYE SHIELD UNIVERSAL CLEAR (GAUZE/BANDAGES/DRESSINGS) ×3 IMPLANT
GLOVE BIOGEL PI IND STRL 7.0 (GLOVE) ×1 IMPLANT
GLOVE BIOGEL PI INDICATOR 7.0 (GLOVE) ×2
LENS ALC ACRYL/TECN (Ophthalmic Related) ×3 IMPLANT
PAD ARMBOARD 7.5X6 YLW CONV (MISCELLANEOUS) ×3 IMPLANT
SYRINGE LUER LOK 1CC (MISCELLANEOUS) ×3 IMPLANT
TAPE SURG TRANSPORE 1 IN (GAUZE/BANDAGES/DRESSINGS) ×1 IMPLANT
TAPE SURGICAL TRANSPORE 1 IN (GAUZE/BANDAGES/DRESSINGS) ×2
WATER STERILE IRR 250ML POUR (IV SOLUTION) ×3 IMPLANT

## 2017-06-03 NOTE — H&P (Signed)
I have reviewed the H&P, the patient was re-examined, and I have identified no interval changes in medical condition and plan of care since the history and physical of record  

## 2017-06-03 NOTE — Discharge Instructions (Signed)

## 2017-06-03 NOTE — Anesthesia Preprocedure Evaluation (Signed)
Anesthesia Evaluation  Patient identified by MRN, date of birth, ID band Patient awake    Airway Mallampati: I  TM Distance: >3 FB Neck ROM: Full    Dental  (+) Teeth Intact   Pulmonary neg pulmonary ROS,    Pulmonary exam normal        Cardiovascular Exercise Tolerance: Good negative cardio ROS Normal cardiovascular exam Rhythm:Regular Rate:Normal     Neuro/Psych negative neurological ROS     GI/Hepatic Gilbert's syndrome, mild, not clinically evident   Endo/Other    Renal/GU      Musculoskeletal   Abdominal   Peds  Hematology negative hematology ROS (+) REFUSES BLOOD PRODUCTS,   Anesthesia Other Findings   Reproductive/Obstetrics                             Anesthesia Physical Anesthesia Plan  ASA: II  Anesthesia Plan: MAC   Post-op Pain Management:    Induction:   PONV Risk Score and Plan:   Airway Management Planned: Nasal Cannula and Natural Airway  Additional Equipment:   Intra-op Plan:   Post-operative Plan:   Informed Consent: I have reviewed the patients History and Physical, chart, labs and discussed the procedure including the risks, benefits and alternatives for the proposed anesthesia with the patient or authorized representative who has indicated his/her understanding and acceptance.     Plan Discussed with: CRNA  Anesthesia Plan Comments:         Anesthesia Quick Evaluation

## 2017-06-03 NOTE — Transfer of Care (Signed)
Immediate Anesthesia Transfer of Care Note  Patient: Henry Kirk  Procedure(s) Performed: Procedure(s) (LRB): CATARACT EXTRACTION PHACO AND INTRAOCULAR LENS PLACEMENT (IOC) (Left)  Patient Location: Shortstay  Anesthesia Type: MAC  Level of Consciousness: awake  Airway & Oxygen Therapy: Patient Spontanous Breathing   Post-op Assessment: Report given to PACU RN, Post -op Vital signs reviewed and stable and Patient moving all extremities  Post vital signs: Reviewed and stable  Complications: No apparent anesthesia complications

## 2017-06-03 NOTE — Op Note (Signed)
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  Assistants: None  Anesthesia: Topical with MAC  Indications: Painless, progressive loss of vision with compromise of daily activities.  Surgery: Cataract Extraction with Intraocular lens Implant Left Eye  Discription: The patient had dilating drops and viscous lidocaine placed into the Left eye in the pre-op holding area. After transfer to the operating room, a time out was performed. The patient was then prepped and draped. Beginning with a 22m blade a paracentesis port was made at the surgeon's 2 o'clock position. The anterior chamber was then filled with 1% non-preserved lidocaine. This was followed by filling the anterior chamber with Provisc.  A 2.428mkeratome blade was used to make a clear corneal incision at the temporal limbus.  A bent cystatome needle was used to create a continuous tear capsulotomy. Hydrodissection was performed with balanced salt solution on a Fine canula. The lens nucleus was then removed using the phacoemulsification handpiece. Residual cortex was removed with the I&A handpiece. The anterior chamber and capsular bag were refilled with Provisc. A posterior chamber intraocular lens was placed into the capsular bag with it's injector. The implant was positioned with the Kuglan hook. The Provisc was then removed from the anterior chamber and capsular bag with the I&A handpiece. Stromal hydration of the main incision and paracentesis port was performed with BSS on a Fine canula. The wounds were tested for leak which was negative. The patient tolerated the procedure well. There were no operative complications. The patient was then transferred to the recovery room in stable condition.  Complications: None  Specimen: None  EBL: None  Prosthetic device: Abbott Technis, PCB00, power 19.5, SN  [id number]

## 2017-06-03 NOTE — Anesthesia Procedure Notes (Signed)
Procedure Name: MAC Date/Time: 06/03/2017 7:49 AM Performed by: Vista Deck Pre-anesthesia Checklist: Patient identified, Emergency Drugs available, Suction available, Timeout performed and Patient being monitored Patient Re-evaluated:Patient Re-evaluated prior to induction Oxygen Delivery Method: Nasal Cannula

## 2017-06-03 NOTE — Anesthesia Postprocedure Evaluation (Signed)
Anesthesia Post Note  Patient: [facility]  Procedure(s) Performed: Procedure(s) (LRB): CATARACT EXTRACTION PHACO AND INTRAOCULAR LENS PLACEMENT (IOC) (Left)  Patient location during evaluation: Short Stay Anesthesia Type: MAC Level of consciousness: awake and alert Pain management: pain level controlled Vital Signs Assessment: post-procedure vital signs reviewed and stable Respiratory status: spontaneous breathing Cardiovascular status: stable Postop Assessment: no signs of nausea or vomiting Anesthetic complications: no     Last Vitals:  Vitals:   06/03/17 0730 06/03/17 0745  BP: (!) 162/75 (!) 145/81  Resp: 14 15  SpO2: 97% 98%    Last Pain: There were no vitals filed for this visit.               Drucie Opitz

## 2017-06-04 ENCOUNTER — Encounter (HOSPITAL_COMMUNITY): Payer: Self-pay | Admitting: Ophthalmology

## 2017-07-04 ENCOUNTER — Ambulatory Visit (INDEPENDENT_AMBULATORY_CARE_PROVIDER_SITE_OTHER): Payer: Medicare Other | Admitting: Family Medicine

## 2017-07-04 ENCOUNTER — Encounter: Payer: Self-pay | Admitting: Family Medicine

## 2017-07-04 ENCOUNTER — Ambulatory Visit (INDEPENDENT_AMBULATORY_CARE_PROVIDER_SITE_OTHER)
Admission: RE | Admit: 2017-07-04 | Discharge: 2017-07-04 | Disposition: A | Discharge status: Home / Self Care | Payer: Medicare Other | Source: Ambulatory Visit | Attending: Family Medicine | Admitting: Family Medicine

## 2017-07-04 VITALS — BP 148/92 | HR 56 | Temp 98.7°F | Ht 73.0 in | Wt 225.8 lb

## 2017-07-04 DIAGNOSIS — R052 Subacute cough: Secondary | ICD-10-CM

## 2017-07-04 DIAGNOSIS — R030 Elevated blood-pressure reading, without diagnosis of hypertension: Secondary | ICD-10-CM

## 2017-07-04 DIAGNOSIS — R05 Cough: Secondary | ICD-10-CM | POA: Diagnosis not present

## 2017-07-04 MED ORDER — ALBUTEROL SULFATE HFA 108 (90 BASE) MCG/ACT IN AERS: 2.0000 | INHALATION_SPRAY | Freq: Four times a day (QID) | RESPIRATORY_TRACT | 0 refills | Status: DC | PRN

## 2017-07-04 NOTE — Assessment & Plan Note (Signed)
S: controlled poorly on no rx. .  BP Readings from Last 3 Encounters:  07/04/17 (!) 148/92  06/03/17 (!) 152/79  05/29/17 (!) 161/80  A/P: We discussed blood pressure goal of <140/90- discussed may very well have developed hypertension. Will try exercise, dash eating plan, home monitoring to see if white coat issue- if none found likely start medication at 3-4 week follow up

## 2017-07-04 NOTE — Progress Notes (Signed)
Subjective:  Henry Kirk is a 71 y.o. year old very pleasant male patient who presents for/with See problem oriented charting ROS- outside of viral illness last week which has improved- no fever, chills. No nausea, vomiting. Has cough particularly at night. Chest pain with coughing episodes only- no exertional symptoms. Denies shortness of breath outside coughing fits.    Past Medical History-  Patient Active Problem List   Diagnosis Date Noted  . Essential tremor 07/01/2014    Priority: Medium  . 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 Hyperlipidemia 04/25/2009    Priority: Medium  . Prostate cancer screening 07/01/2014    Priority: Low  . Other malaise and fatigue 04/26/2009    Priority: Low  . Elevated blood pressure reading 07/04/2017    Medications- reviewed and updated Current Outpatient Prescriptions  Medication Sig Dispense Refill  . albuterol (PROVENTIL HFA;VENTOLIN HFA) 108 (90 Base) MCG/ACT inhaler Inhale 2 puffs into the lungs every 6 (six) hours as needed for wheezing or shortness of breath. 1 Inhaler 0  . rosuvastatin (CRESTOR) 20 MG tablet Take 1 tablet (20 mg total) by mouth daily. (Patient taking differently: Take 20 mg by mouth every evening. ) 30 tablet 6   No current facility-administered medications for this visit.     Objective: BP (!) 148/92 (BP Location: Left Arm, Patient Position: Sitting, Cuff Size: Large)   Pulse (!) 56   Temp 98.7 F (37.1 C) (Oral)   Ht 6\' 1"  (1.854 m)   Wt 225 lb 12.8 oz (102.4 kg)   SpO2 95%   BMI 29.79 kg/m  Gen: NAD, appears slightly fatigued CV: RRR (slightly bradycardic) no murmurs rubs or gallops Lungs: LLL with slight crackles and wheeze. otherwise no crackles, wheeze, rhonchi Abdomen: soft/nontender/nondistended/normal bowel sounds. No rebound or guarding.  Ext: no edema Skin: warm, dry  Assessment/Plan:  Subacute cough - Plan: DG Chest 2 View  Elevated blood pressure reading S:  mild cough  since 02/13/17 at least.    Over the last month cough has increased. sometimes gets bad coughing fits and feels exhausted with it and mild chest pain if keeps coughing. Does cough up yellow sputum. Occasionally hears a slight wheez when he lays down. Wife has put him into another room- hard to sleep- wife heard some crackling noises in her chest.   No shortness of breath with this. Denies recent lightheadedness. No chest pain other than when he coughs (soreness can linger after bad fits of coughing). Did get illness last week when had low grade fever but that has resolved- cough worsened with this. No runny nose, sinus pain, ear pain. Some slight drainage from throat during this. Not sneezing much. Minimal discharge from nose. Feels symptoms slightly worsening.   Had walking pneumonia in the [place] years ago that felt similar to this A/P: possible PNA with crackles LLL. Also possible viral bronchitis. Would use prednisone except for elevated BP. Will trial albuterol. Discussed even if bronchitis- would consider antibiotic considering worsening symptoms despite already having for 4 weeks.   Elevated blood pressure reading S: controlled poorly on no rx. .  BP Readings from Last 3 Encounters:  07/04/17 (!) 148/92  06/03/17 (!) 152/79  05/29/17 (!) 161/80  A/P: We discussed blood pressure goal of <140/90- discussed may very well have developed hypertension. Will try exercise, dash eating plan, home monitoring to see if white coat issue- if none found likely start medication at 3-4 week follow up  3-4 week  Orders Placed This Encounter  Procedures  . DG Chest 2 View    Standing Status:   Future    Number of Occurrences:   1    Standing Expiration Date:   09/03/2018    Order Specific Question:   Reason for Exam (SYMPTOM  OR DIAGNOSIS REQUIRED)    Answer:   subacute cough. crackles LLL with some wheeze. suspect bronchitis but rule out PNA    Order Specific Question:   Preferred imaging location?     Answer:   [facility]    Meds ordered this encounter  Medications  . albuterol (PROVENTIL HFA;VENTOLIN HFA) 108 (90 Base) MCG/ACT inhaler    Sig: Inhale 2 puffs into the lungs every 6 (six) hours as needed for wheezing or shortness of breath.    Dispense:  1 Inhaler    Refill:  0    Return precautions advised.  Garret Reddish, MD

## 2017-07-04 NOTE — Patient Instructions (Addendum)
Try inhaler for coughing fits  Please go to [facility] - located [address] across the [address] from [place] - in the basement - Hours: 8:30-5:30 PM M-F. Do not need appointment.   Possible pneumonia or bronchitis- Given duration and not improving- I may end up using antibiotics either way   Your blood pressure trend concerns me. I would like for you to buy/use a home cuff to check daily . Your goal is <140/90 ideally but at least <150/90. See me in 3-4 weeks. Bring your home cuff and your log of blood pressures with you to visit.   Regular exercise and dash eating plan can help though I understand you will likely want to wait on exercise until breathing Is better   DASH Eating Plan DASH stands for "Dietary Approaches to Stop Hypertension." The DASH eating plan is a healthy eating plan that has been shown to reduce high blood pressure (hypertension). It may also reduce your risk for type 2 diabetes, heart disease, and stroke. The DASH eating plan may also help with weight loss. What are tips for following this plan? General guidelines  Avoid eating more than 2,300 mg (milligrams) of salt (sodium) a day. If you have hypertension, you may need to reduce your sodium intake to 1,500 mg a day.  Limit alcohol intake to no more than 1 drink a day for nonpregnant women and 2 drinks a day for men. One drink equals 12 oz of beer, 5 oz of wine, or 1 oz of hard liquor.  Work with your health care provider to maintain a healthy body weight or to lose weight. Ask what an ideal weight is for you.  Get at least 30 minutes of exercise that causes your heart to beat faster (aerobic exercise) most days of the week. Activities may include walking, swimming, or biking.  Work with your health care provider or diet and nutrition specialist (dietitian) to adjust your eating plan to your individual calorie needs. Reading food labels  Check food labels for the amount of sodium per serving. Choose  foods with less than 5 percent of the Daily Value of sodium. Generally, foods with less than 300 mg of sodium per serving fit into this eating plan.  To find whole grains, look for the word "whole" as the first word in the ingredient list. Shopping  Buy products labeled as "low-sodium" or "no salt added."  Buy fresh foods. Avoid canned foods and premade or frozen meals. Cooking  Avoid adding salt when cooking. Use salt-free seasonings or herbs instead of table salt or sea salt. Check with your health care provider or pharmacist before using salt substitutes.  Do not fry foods. Cook foods using healthy methods such as baking, boiling, grilling, and broiling instead.  Cook with heart-healthy oils, such as olive, canola, soybean, or sunflower oil. Meal planning   Eat a balanced diet that includes: ? 5 or more servings of fruits and vegetables each day. At each meal, try to fill half of your plate with fruits and vegetables. ? Up to 6-8 servings of whole grains each day. ? Less than 6 oz of lean meat, poultry, or fish each day. A 3-oz serving of meat is about the same size as a deck of cards. One egg equals 1 oz. ? 2 servings of low-fat dairy each day. ? A serving of nuts, seeds, or beans 5 times each week. ? Heart-healthy fats. Healthy fats called Omega-3 fatty acids are found in foods such  as flaxseeds and coldwater fish, like sardines, salmon, and mackerel.  Limit how much you eat of the following: ? Canned or prepackaged foods. ? Food that is high in trans fat, such as fried foods. ? Food that is high in saturated fat, such as fatty meat. ? Sweets, desserts, sugary drinks, and other foods with added sugar. ? Full-fat dairy products.  Do not salt foods before eating.  Try to eat at least 2 vegetarian meals each week.  Eat more home-cooked food and less restaurant, buffet, and fast food.  When eating at a restaurant, ask that your food be prepared with less salt or no salt, if  possible. What foods are recommended? The items listed may not be a complete list. Talk with your dietitian about what dietary choices are best for you. Grains Whole-grain or whole-wheat bread. Whole-grain or whole-wheat pasta. Brown rice. [place]. Bulgur. Whole-grain and low-sodium cereals. Pita bread. Low-fat, low-sodium crackers. Whole-wheat flour tortillas. Vegetables Fresh or frozen vegetables (raw, steamed, roasted, or grilled). Low-sodium or reduced-sodium tomato and vegetable juice. Low-sodium or reduced-sodium tomato sauce and tomato paste. Low-sodium or reduced-sodium canned vegetables. Fruits All fresh, dried, or frozen fruit. Canned fruit in natural juice (without added sugar). Meat and other protein foods Skinless chicken or [place]. Ground chicken or [place]. Pork with fat trimmed off. Fish and seafood. Egg whites. Dried beans, peas, or lentils. Unsalted nuts, nut butters, and seeds. Unsalted canned beans. Lean cuts of beef with fat trimmed off. Low-sodium, lean deli meat. Dairy Low-fat (1%) or fat-free (skim) milk. Fat-free, low-fat, or reduced-fat cheeses. Nonfat, low-sodium ricotta or cottage cheese. Low-fat or nonfat yogurt. Low-fat, low-sodium cheese. Fats and oils Soft margarine without trans fats. Vegetable oil. Low-fat, reduced-fat, or light mayonnaise and salad dressings (reduced-sodium). Canola, safflower, olive, soybean, and sunflower oils. Avocado. Seasoning and other foods Herbs. Spices. Seasoning mixes without salt. Unsalted popcorn and pretzels. Fat-free sweets. What foods are not recommended? The items listed may not be a complete list. Talk with your dietitian about what dietary choices are best for you. Grains Baked goods made with fat, such as croissants, muffins, or some breads. Dry pasta or rice meal packs. Vegetables Creamed or fried vegetables. Vegetables in a cheese sauce. Regular canned vegetables (not low-sodium or reduced-sodium). Regular canned  tomato sauce and paste (not low-sodium or reduced-sodium). Regular tomato and vegetable juice (not low-sodium or reduced-sodium). Angie Fava. Olives. Fruits Canned fruit in a light or heavy syrup. Fried fruit. Fruit in cream or butter sauce. Meat and other protein foods Fatty cuts of meat. Ribs. Fried meat. Berniece Salines. Sausage. Bologna and other processed lunch meats. Salami. Fatback. Hotdogs. Bratwurst. Salted nuts and seeds. Canned beans with added salt. Canned or smoked fish. Whole eggs or egg yolks. Chicken or [place] with skin. Dairy Whole or 2% milk, cream, and half-and-half. Whole or full-fat cream cheese. Whole-fat or sweetened yogurt. Full-fat cheese. Nondairy creamers. Whipped toppings. Processed cheese and cheese spreads. Fats and oils Butter. Stick margarine. Lard. Shortening. Ghee. Bacon fat. Tropical oils, such as coconut, palm kernel, or palm oil. Seasoning and other foods Salted popcorn and pretzels. Onion salt, garlic salt, seasoned salt, table salt, and sea salt. Worcestershire sauce. Tartar sauce. Barbecue sauce. Teriyaki sauce. Soy sauce, including reduced-sodium. Steak sauce. Canned and packaged gravies. Fish sauce. Oyster sauce. Cocktail sauce. Horseradish that you find on the shelf. Ketchup. Mustard. Meat flavorings and tenderizers. Bouillon cubes. Hot sauce and Tabasco sauce. Premade or packaged marinades. Premade or packaged taco seasonings. Relishes. Regular salad dressings.  Where to find more information:  National Heart, Lung, and [place]: https://wilson-eaton.com/  American Heart Association: www.heart.org Summary  The DASH eating plan is a healthy eating plan that has been shown to reduce high blood pressure (hypertension). It may also reduce your risk for type 2 diabetes, heart disease, and stroke.  With the DASH eating plan, you should limit salt (sodium) intake to 2,300 mg a day. If you have hypertension, you may need to reduce your sodium intake to 1,500 mg a day.  When  on the DASH eating plan, aim to eat more fresh fruits and vegetables, whole grains, lean proteins, low-fat dairy, and heart-healthy fats.  Work with your health care provider or diet and nutrition specialist (dietitian) to adjust your eating plan to your individual calorie needs. This information is not intended to replace advice given to you by your health care provider. Make sure you discuss any questions you have with your health care provider. Document Released: 09/13/2011 Document Revised: 09/17/2016 Document Reviewed: 09/17/2016 Elsevier Interactive Patient Education  2017 [employer].

## 2017-08-12 DIAGNOSIS — H25811 Combined forms of age-related cataract, right eye: Secondary | ICD-10-CM | POA: Diagnosis not present

## 2017-08-12 DIAGNOSIS — Z961 Presence of intraocular lens: Secondary | ICD-10-CM | POA: Diagnosis not present

## 2017-08-19 ENCOUNTER — Encounter (HOSPITAL_COMMUNITY): Payer: Self-pay

## 2017-08-19 ENCOUNTER — Encounter (HOSPITAL_COMMUNITY)
Admission: RE | Admit: 2017-08-19 | Discharge: 2017-08-19 | Disposition: A | Discharge status: Home / Self Care | Payer: Medicare Other | Source: Ambulatory Visit | Attending: Ophthalmology | Admitting: Ophthalmology

## 2017-08-27 DIAGNOSIS — H25811 Combined forms of age-related cataract, right eye: Secondary | ICD-10-CM | POA: Diagnosis not present

## 2017-09-02 ENCOUNTER — Ambulatory Visit (HOSPITAL_COMMUNITY)
Admission: RE | Admit: 2017-09-02 | Discharge: 2017-09-02 | Disposition: A | Discharge status: Home / Self Care | Payer: Medicare Other | Source: Ambulatory Visit | Attending: Ophthalmology | Admitting: Ophthalmology

## 2017-09-02 ENCOUNTER — Ambulatory Visit (HOSPITAL_COMMUNITY): Payer: Medicare Other | Admitting: Anesthesiology

## 2017-09-02 ENCOUNTER — Encounter (HOSPITAL_COMMUNITY): Payer: Self-pay | Admitting: *Deleted

## 2017-09-02 ENCOUNTER — Encounter (HOSPITAL_COMMUNITY)
Admission: RE | Disposition: A | Discharge status: Home / Self Care | Payer: Self-pay | Source: Ambulatory Visit | Attending: Ophthalmology

## 2017-09-02 DIAGNOSIS — Z79899 Other long term (current) drug therapy: Secondary | ICD-10-CM | POA: Diagnosis not present

## 2017-09-02 DIAGNOSIS — E7800 Pure hypercholesterolemia, unspecified: Secondary | ICD-10-CM | POA: Diagnosis not present

## 2017-09-02 DIAGNOSIS — H2511 Age-related nuclear cataract, right eye: Secondary | ICD-10-CM | POA: Diagnosis not present

## 2017-09-02 DIAGNOSIS — H25811 Combined forms of age-related cataract, right eye: Secondary | ICD-10-CM | POA: Diagnosis not present

## 2017-09-02 HISTORY — PX: CATARACT EXTRACTION W/PHACO: SHX586

## 2017-09-02 SURGERY — PHACOEMULSIFICATION, CATARACT, WITH IOL INSERTION
Anesthesia: Monitor Anesthesia Care | Site: Eye | Laterality: Right

## 2017-09-02 MED ORDER — BSS IO SOLN
INTRAOCULAR | Status: DC | PRN
  Administered 2017-09-02: 15 mL via INTRAOCULAR

## 2017-09-02 MED ORDER — CYCLOPENTOLATE-PHENYLEPHRINE 0.2-1 % OP SOLN
1.0000 [drp] | OPHTHALMIC | Status: AC
  Administered 2017-09-02 (×3): 1 [drp] via OPHTHALMIC

## 2017-09-02 MED ORDER — MIDAZOLAM HCL 2 MG/2ML IJ SOLN
INTRAMUSCULAR | Status: AC
  Filled 2017-09-02: qty 2

## 2017-09-02 MED ORDER — FENTANYL CITRATE (PF) 100 MCG/2ML IJ SOLN
INTRAMUSCULAR | Status: AC
  Filled 2017-09-02: qty 2

## 2017-09-02 MED ORDER — MIDAZOLAM HCL 2 MG/2ML IJ SOLN
1.0000 mg | INTRAMUSCULAR | Status: AC
  Administered 2017-09-02: 2 mg via INTRAVENOUS

## 2017-09-02 MED ORDER — FENTANYL CITRATE (PF) 100 MCG/2ML IJ SOLN
25.0000 ug | Freq: Once | INTRAMUSCULAR | Status: AC
  Administered 2017-09-02: 25 ug via INTRAVENOUS

## 2017-09-02 MED ORDER — PROVISC 10 MG/ML IO SOLN
INTRAOCULAR | Status: DC | PRN
  Administered 2017-09-02: 0.85 mL via INTRAOCULAR

## 2017-09-02 MED ORDER — LACTATED RINGERS IV SOLN
INTRAVENOUS | Status: DC
  Administered 2017-09-02: 1000 mL via INTRAVENOUS

## 2017-09-02 MED ORDER — PHENYLEPHRINE HCL 2.5 % OP SOLN
1.0000 [drp] | OPHTHALMIC | Status: AC
  Administered 2017-09-02 (×3): 1 [drp] via OPHTHALMIC

## 2017-09-02 MED ORDER — LIDOCAINE HCL 3.5 % OP GEL
1.0000 "application " | Freq: Once | OPHTHALMIC | Status: AC
  Administered 2017-09-02: 1 via OPHTHALMIC

## 2017-09-02 MED ORDER — EPINEPHRINE PF 1 MG/ML IJ SOLN
INTRAMUSCULAR | Status: AC
  Filled 2017-09-02: qty 1

## 2017-09-02 MED ORDER — LIDOCAINE HCL (PF) 1 % IJ SOLN
INTRAMUSCULAR | Status: DC | PRN
  Administered 2017-09-02: .5 mL

## 2017-09-02 MED ORDER — EPINEPHRINE PF 1 MG/ML IJ SOLN
INTRAOCULAR | Status: DC | PRN
  Administered 2017-09-02: 500 mL

## 2017-09-02 MED ORDER — POVIDONE-IODINE 5 % OP SOLN
OPHTHALMIC | Status: DC | PRN
  Administered 2017-09-02: 1 via OPHTHALMIC

## 2017-09-02 MED ORDER — TETRACAINE HCL 0.5 % OP SOLN
1.0000 [drp] | OPHTHALMIC | Status: AC
  Administered 2017-09-02 (×3): 1 [drp] via OPHTHALMIC

## 2017-09-02 MED ORDER — NEOMYCIN-POLYMYXIN-DEXAMETH 3.5-10000-0.1 OP SUSP
OPHTHALMIC | Status: DC | PRN
  Administered 2017-09-02: 2 [drp] via OPHTHALMIC

## 2017-09-02 SURGICAL SUPPLY — 10 items
CLOTH BEACON ORANGE TIMEOUT ST (SAFETY) ×3 IMPLANT
EYE SHIELD UNIVERSAL CLEAR (GAUZE/BANDAGES/DRESSINGS) ×3 IMPLANT
GLOVE BIOGEL PI IND STRL 7.0 (GLOVE) ×2 IMPLANT
GLOVE BIOGEL PI INDICATOR 7.0 (GLOVE) ×4
PAD ARMBOARD 7.5X6 YLW CONV (MISCELLANEOUS) ×3 IMPLANT
SIGHTPATH CAT PROC W REG LENS (Ophthalmic Related) ×3 IMPLANT
SYRINGE LUER LOK 1CC (MISCELLANEOUS) ×3 IMPLANT
TAPE SURG TRANSPORE 1 IN (GAUZE/BANDAGES/DRESSINGS) ×1 IMPLANT
TAPE SURGICAL TRANSPORE 1 IN (GAUZE/BANDAGES/DRESSINGS) ×2
WATER STERILE IRR 250ML POUR (IV SOLUTION) ×3 IMPLANT

## 2017-09-02 NOTE — Anesthesia Preprocedure Evaluation (Signed)
Anesthesia Evaluation  Patient identified by MRN, date of birth, ID band Patient awake    Airway Mallampati: I  TM Distance: >3 FB Neck ROM: Full    Dental  (+) Teeth Intact   Pulmonary neg pulmonary ROS,    Pulmonary exam normal        Cardiovascular Exercise Tolerance: Good negative cardio ROS Normal cardiovascular exam Rhythm:Regular Rate:Normal     Neuro/Psych negative neurological ROS     GI/Hepatic negative GI ROS, Gilbert's syndrome, mild, not clinically evident   Endo/Other    Renal/GU      Musculoskeletal   Abdominal   Peds  Hematology negative hematology ROS (+) REFUSES BLOOD PRODUCTS,   Anesthesia Other Findings   Reproductive/Obstetrics                             Anesthesia Physical Anesthesia Plan  ASA: II  Anesthesia Plan:    Post-op Pain Management:    Induction:   PONV Risk Score and Plan:   Airway Management Planned: Nasal Cannula and Natural Airway  Additional Equipment:   Intra-op Plan:   Post-operative Plan:   Informed Consent: I have reviewed the patients History and Physical, chart, labs and discussed the procedure including the risks, benefits and alternatives for the proposed anesthesia with the patient or authorized representative who has indicated his/her understanding and acceptance.     Plan Discussed with: CRNA  Anesthesia Plan Comments:         Anesthesia Quick Evaluation

## 2017-09-02 NOTE — Transfer of Care (Signed)
Immediate Anesthesia Transfer of Care Note  Patient: Henry Kirk  Procedure(s) Performed: CATARACT EXTRACTION PHACO AND INTRAOCULAR LENS PLACEMENT RIGHT EYE (Right Eye)  Patient Location: Short Stay  Anesthesia Type:MAC  Level of Consciousness: awake and alert   Airway & Oxygen Therapy: Patient Spontanous Breathing  Post-op Assessment: Report given to RN and Post -op Vital signs reviewed and stable  Post vital signs: Reviewed and stable  Last Vitals:  Vitals:   09/02/17 0730 09/02/17 0745  BP: (!) 147/76 114/74  Resp: 14 11  Temp:    SpO2: 99% 97%    Last Pain:  Vitals:   09/02/17 0704  TempSrc: Oral      Patients Stated Pain Goal: 6 (94/70/96 [id number])  Complications: No apparent anesthesia complications

## 2017-09-02 NOTE — Discharge Instructions (Signed)
Cataract Surgery, Care After Refer to this sheet in the next few weeks. These instructions provide you with information about caring for yourself after your procedure. Your health care provider may also give you more specific instructions. Your treatment has been planned according to current medical practices, but problems sometimes occur. Call your health care provider if you have any problems or questions after your procedure. What can I expect after the procedure? After the procedure, it is common to have:  Itching.  Discomfort.  Fluid discharge.  Sensitivity to light and to touch.  Bruising.  Follow these instructions at home: [place] your eye every day for signs of infection. Watch for: ? Redness, swelling, or pain. ? Fluid, blood, or pus. ? Warmth. ? Bad smell. Activity  Avoid strenuous activities, such as playing contact sports, for as long as told by your health care provider.  Do not drive or operate heavy machinery until your health care provider approves.  Do not bend or lift heavy objects. Bending increases pressure in the eye. You can walk, climb stairs, and do light household chores.  Ask your health care provider when you can return to work. If you work in a dusty environment, you may be advised to wear protective eyewear for a period of time. General instructions  Take or apply over-the-counter and prescription medicines only as told by your health care provider. This includes eye drops.  Do not touch or rub your eyes.  If you were given a protective shield, wear it as told by your health care provider. If you were not given a protective shield, wear sunglasses as told by your health care provider to protect your eyes.  Keep the area around your eye clean and dry. Avoid swimming or allowing water to hit you directly in the face while showering until told by your health care provider. Keep soap and shampoo out of your eyes.  Do not put a contact lens  into the affected eye or eyes until your health care provider approves.  Keep all follow-up visits as told by your health care provider. This is important. Contact a health care provider if:   You have increased bruising around your eye.  You have pain that is not helped with medicine.  You have a fever.  You have redness, swelling, or pain in your eye.  You have fluid, blood, or pus coming from your incision.  Your vision gets worse. Get help right away if:  You have sudden vision loss. This information is not intended to replace advice given to you by your health care provider. Make sure you discuss any questions you have with your health care provider. Document Released: 04/13/2005 Document Revised: 02/02/2016 Document Reviewed: 08/04/2015 Elsevier Interactive Patient Education  2017 [employer].

## 2017-09-02 NOTE — H&P (Signed)
I have reviewed the H&P, the patient was re-examined, and I have identified no interval changes in medical condition and plan of care since the history and physical of record  

## 2017-09-02 NOTE — Anesthesia Postprocedure Evaluation (Signed)
Anesthesia Post Note  Patient: Henry Kirk [facility]  Procedure(s) Performed: CATARACT EXTRACTION PHACO AND INTRAOCULAR LENS PLACEMENT RIGHT EYE (Right Eye)  Patient location during evaluation: Short Stay Anesthesia Type: MAC Level of consciousness: awake and alert Pain management: pain level controlled Vital Signs Assessment: post-procedure vital signs reviewed and stable Respiratory status: spontaneous breathing Cardiovascular status: stable Postop Assessment: no apparent nausea or vomiting Anesthetic complications: no     Last Vitals:  Vitals:   09/02/17 0730 09/02/17 0745  BP: (!) 147/76 114/74  Resp: 14 11  Temp:    SpO2: 99% 97%    Last Pain:  Vitals:   09/02/17 0704  TempSrc: Oral                 Suri Tafolla

## 2017-09-02 NOTE — Anesthesia Procedure Notes (Signed)
Procedure Name: MAC Date/Time: 09/02/2017 8:00 AM Performed by: Vista Deck, CRNA Pre-anesthesia Checklist: Patient identified, Emergency Drugs available, Suction available, Timeout performed and Patient being monitored Patient Re-evaluated:Patient Re-evaluated prior to induction Oxygen Delivery Method: Nasal Cannula

## 2017-09-02 NOTE — Op Note (Signed)
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  Assistants: None  Anesthesia: Topical with MAC  Indications: Painless, progressive loss of vision with compromise of daily activities.  Surgery: Cataract Extraction with Intraocular lens Implant Right Eye  Discription: The patient had dilating drops and viscous lidocaine placed into the Right eye in the pre-op holding area. After transfer to the operating room, a time out was performed. The patient was then prepped and draped. Beginning with a 65m blade a paracentesis port was made at the surgeon's 2 o'clock position. The anterior chamber was then filled with 1% non-preserved lidocaine. This was followed by filling the anterior chamber with Provisc.  A 2.455mkeratome blade was used to make a clear corneal incision at the temporal limbus.  A bent cystatome needle was used to create a continuous tear capsulotomy. Hydrodissection was performed with balanced salt solution on a Fine canula. The lens nucleus was then removed using the phacoemulsification handpiece. Residual cortex was removed with the I&A handpiece. The anterior chamber and capsular bag were refilled with Provisc. A posterior chamber intraocular lens was placed into the capsular bag with it's injector. The implant was positioned with the Kuglan hook. The Provisc was then removed from the anterior chamber and capsular bag with the I&A handpiece. Stromal hydration of the main incision and paracentesis port was performed with BSS on a Fine canula. The wounds were tested for leak which was negative. The patient tolerated the procedure well. There were no operative complications. The patient was then transferred to the recovery room in stable condition.  Complications: None  Specimen: None  EBL: None  Prosthetic device: J[employer]J Technis, PCB00, power 20.5,  SN [id number]

## 2017-09-03 ENCOUNTER — Encounter (HOSPITAL_COMMUNITY): Payer: Self-pay | Admitting: Ophthalmology

## 2017-10-10 ENCOUNTER — Other Ambulatory Visit: Payer: Self-pay | Admitting: Cardiovascular Disease

## 2017-12-04 ENCOUNTER — Other Ambulatory Visit: Payer: Self-pay | Admitting: Cardiovascular Disease

## 2017-12-12 IMAGING — NM NM MISC PROCEDURE
6 series · 36 of 36 positions shown · non-contrast
Comparison: none

[Series 1: wbr rest · 6.40mm/px · 6 of 64 frames shown]
[frame 6/64]
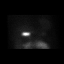
[frame 16/64]
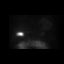
[frame 27/64]
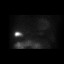
[frame 38/64]
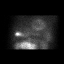
[frame 48/64]
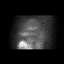
[frame 59/64]
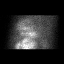

[Series 1: wbr_r-proj_st wbr rest · 6.40mm/px · 6 of 64 frames shown]
[frame 6/64]
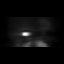
[frame 16/64]
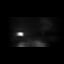
[frame 27/64]
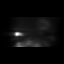
[frame 38/64]
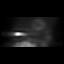
[frame 48/64]
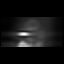
[frame 59/64]
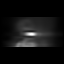

[Series 1: rest sax · 6.4mm · 6.40mm/px · 6 of 25 frames shown]
[frame 3/25]
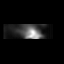
[frame 7/25]
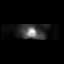
[frame 11/25]
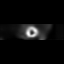
[frame 15/25]
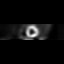
[frame 19/25]
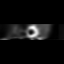
[frame 23/25]
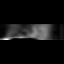

[Series 2: wbr_s-proj_st wbr stress ng · 6.40mm/px · 6 of 64 frames shown]
[frame 6/64]
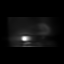
[frame 16/64]
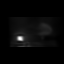
[frame 27/64]
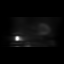
[frame 38/64]
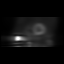
[frame 48/64]
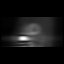
[frame 59/64]
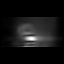

[Series 2: wbr stress ng · 6.40mm/px · 6 of 64 frames shown]
[frame 6/64]
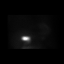
[frame 16/64]
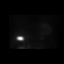
[frame 27/64]
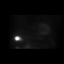
[frame 38/64]
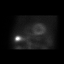
[frame 48/64]
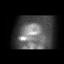
[frame 59/64]
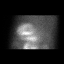

[Series 2: stress sax · 6.4mm · 6.40mm/px · 6 of 25 frames shown]
[frame 3/25]
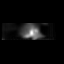
[frame 7/25]
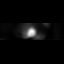
[frame 11/25]
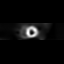
[frame 15/25]
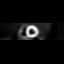
[frame 19/25]
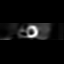
[frame 23/25]
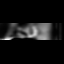

[36 of 36 positions shown; findings below may reference images not displayed]

Canned report from images found in remote index.

Refer to host system for actual result text.

## 2018-03-19 ENCOUNTER — Encounter: Payer: Self-pay | Admitting: Family Medicine

## 2018-03-19 ENCOUNTER — Ambulatory Visit (INDEPENDENT_AMBULATORY_CARE_PROVIDER_SITE_OTHER): Payer: Medicare Other | Admitting: Family Medicine

## 2018-03-19 VITALS — BP 140/92 | HR 57 | Temp 97.7°F | Ht 73.0 in | Wt 228.2 lb

## 2018-03-19 DIAGNOSIS — B079 Viral wart, unspecified: Secondary | ICD-10-CM | POA: Diagnosis not present

## 2018-03-19 DIAGNOSIS — Z23 Encounter for immunization: Secondary | ICD-10-CM

## 2018-03-19 DIAGNOSIS — R059 Cough, unspecified: Secondary | ICD-10-CM

## 2018-03-19 DIAGNOSIS — I10 Essential (primary) hypertension: Secondary | ICD-10-CM

## 2018-03-19 DIAGNOSIS — Z0000 Encounter for general adult medical examination without abnormal findings: Secondary | ICD-10-CM

## 2018-03-19 DIAGNOSIS — Z125 Encounter for screening for malignant neoplasm of prostate: Secondary | ICD-10-CM | POA: Diagnosis not present

## 2018-03-19 DIAGNOSIS — R05 Cough: Secondary | ICD-10-CM | POA: Diagnosis not present

## 2018-03-19 DIAGNOSIS — Z0001 Encounter for general adult medical examination with abnormal findings: Secondary | ICD-10-CM

## 2018-03-19 DIAGNOSIS — E785 Hyperlipidemia, unspecified: Secondary | ICD-10-CM | POA: Diagnosis not present

## 2018-03-19 LAB — LIPID PANEL
Cholesterol: 121 mg/dL (ref 0–200)
HDL: 36.4 mg/dL — ABNORMAL LOW
LDL Cholesterol: 72 mg/dL (ref 0–99)
NonHDL: 84.32
Total CHOL/HDL Ratio: 3
Triglycerides: 60 mg/dL (ref 0.0–149.0)
VLDL: 12 mg/dL (ref 0.0–40.0)

## 2018-03-19 LAB — CBC
HCT: 44.9 % (ref 39.0–52.0)
Hemoglobin: 15.6 g/dL (ref 13.0–17.0)
MCHC: 34.7 g/dL (ref 30.0–36.0)
MCV: 91.8 fl (ref 78.0–100.0)
Platelets: 240 K/uL (ref 150.0–400.0)
RBC: 4.9 Mil/uL (ref 4.22–5.81)
RDW: 13.5 % (ref 11.5–15.5)
WBC: 4.7 K/uL (ref 4.0–10.5)

## 2018-03-19 LAB — COMPREHENSIVE METABOLIC PANEL WITH GFR
ALT: 22 U/L (ref 0–53)
AST: 26 U/L (ref 0–37)
Albumin: 4.1 g/dL (ref 3.5–5.2)
Alkaline Phosphatase: 64 U/L (ref 39–117)
BUN: 11 mg/dL (ref 6–23)
CO2: 31 meq/L (ref 19–32)
Calcium: 9.4 mg/dL (ref 8.4–10.5)
Chloride: 104 meq/L (ref 96–112)
Creatinine, Ser: 1.03 mg/dL (ref 0.40–1.50)
GFR: 75.3 mL/min
Glucose, Bld: 86 mg/dL (ref 70–99)
Potassium: 4.4 meq/L (ref 3.5–5.1)
Sodium: 142 meq/L (ref 135–145)
Total Bilirubin: 2.3 mg/dL — ABNORMAL HIGH (ref 0.2–1.2)
Total Protein: 6.6 g/dL (ref 6.0–8.3)

## 2018-03-19 LAB — PSA: PSA: 3.65 ng/mL (ref 0.10–4.00)

## 2018-03-19 MED ORDER — AMLODIPINE BESYLATE 2.5 MG PO TABS: 2.5000 mg | ORAL_TABLET | Freq: Every day | ORAL | 5 refills | Status: DC

## 2018-03-19 NOTE — Assessment & Plan Note (Signed)
have advised statin in past and he declinesd he agreed to start when Dr. Claiborne Billings recommended last year during cardiology evaluation

## 2018-03-19 NOTE — Assessment & Plan Note (Signed)
S: Had cough for months after next visit then resolved. Cough came back in the spring. Gets some wheezing type sounds at night that go away with cough. Has regular AM cough- coughs up yellow phlegm then feels better- restarts at night.  A/P: no history of allergies but has been bad allergy season- will treat for potential allergies- Trial zyrtec and flonase together until 2 weeks to see if this helps with your cough/congestion. We can recheck when he see him for BP

## 2018-03-19 NOTE — Patient Instructions (Addendum)
Prevnar 13 today   Please check with your pharmacy to see if they have the shingrix vaccine. If they do- please get this immunization and update [place] by phone call or mychart with dates you receive the vaccine  New diagnosis of high blood pressure. Start amlodipine 2.5mg - follow up in 2-4 weeks for blood pressure recheck. Try to check some home #s and bring your home cuff with you to next visit  Trial zyrtec and flonase together until visit with me to see if this helps with your cough/congestion. Lungs clear today but you do look like you are dealing with allergies based on exam  Glad you are seeing dermatology in a month- suspect you may need another round of treatment. We can also refreeze when we see you back if needed if it doesn't look like its clearing completely after scab is gone.

## 2018-03-19 NOTE — Progress Notes (Signed)
Phone: [phone]  Subjective:  Patient presents today for their annual physical. Chief complaint-noted.   See problem oriented charting- ROS- full  review of systems was completed and negative except for: noisy breathing, cough at times  The following were reviewed and entered/updated in epic: Past Medical History:  Diagnosis Date  . Elevated PSA    with prostatitis  . Gilbert's syndrome   . Hemorrhoids, external   . Hyperlipidemia   . Spermatic cord cyst    Patient Active Problem List   Diagnosis Date Noted  . Hypertension, essential 03/19/2018    Priority: Medium  . Essential tremor 07/01/2014    Priority: Medium  . 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 Hyperlipidemia 04/25/2009    Priority: Medium  . Prostate cancer screening 07/01/2014    Priority: Low  . Other malaise and fatigue 04/26/2009    Priority: Low  . Cough 03/19/2018   Past Surgical History:  Procedure Laterality Date  . CATARACT EXTRACTION W/PHACO Left 06/03/2017   Procedure: CATARACT EXTRACTION PHACO AND INTRAOCULAR LENS PLACEMENT (IOC);  Surgeon: Tonny Branch, MD;  Location: [facility];  Service: Ophthalmology;  Laterality: Left;  CDE: 15.27  . CATARACT EXTRACTION W/PHACO Right 09/02/2017   Procedure: CATARACT EXTRACTION PHACO AND INTRAOCULAR LENS PLACEMENT RIGHT EYE;  Surgeon: Tonny Branch, MD;  Location: [facility];  Service: Ophthalmology;  Laterality: Right;  CDE: 7.92  . HAND SURGERY  2005   "lumps on head"    Family History  Problem Relation Age of Onset  . Cancer Father        spinal 4  . Cancer Son        testicular cancer  . Cancer Paternal Grandfather        spinal 44  . Prostate cancer Maternal Uncle        did not die of    Medications- reviewed and updated Current Outpatient Medications  Medication Sig Dispense Refill  . rosuvastatin (CRESTOR) 20 MG tablet TAKE 1 TABLET(20 MG) BY MOUTH DAILY 30 tablet 6  . amLODipine (NORVASC) 2.5 MG tablet Take 1 tablet (2.5 mg  total) by mouth daily. 30 tablet 5   No current facility-administered medications for this visit.     Allergies-reviewed and updated No Known Allergies  Social History   Social History Narrative   Married 1966. 2 sons Randall Hiss, not married and no kids, and [place] with 1 daughter, not married. All children live at home.       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       Hobbies: yardwork, working at [employer].     Objective: BP (!) 140/92 (BP Location: Left Arm, Patient Position: Sitting, Cuff Size: Large)   Pulse (!) 57   Temp 97.7 F (36.5 C) (Oral)   Ht 6\' 1"  (1.854 m)   Wt 228 lb 3.2 oz (103.5 kg)   SpO2 95%   BMI 30.11 kg/m  Gen: NAD, resting comfortably HEENT: Mucous membranes are moist. Oropharynx normal, turbinates with clear drainage note Neck: no thyromegaly CV: RRR- not bradycardic on my exam, no murmurs rubs or gallops Lungs: CTAB no crackles, wheeze, rhonchi Abdomen: soft/nontender/nondistended/normal bowel sounds. No rebound or guarding.  Ext: trace bilateral edema Skin: warm, dry, right hand 5x 5 mm raised lesion with verrucous appearance Neuro: grossly normal, moves all extremities, PERRLA  Procedure note: Benefits and risks verbally discussed with patient 10 second freeze thaw cycle of cryotherapy performed  with liquid  nitrogen to right hand wart No complications.  Patient tolerated the procedure well other than mild pain. Gave handout on this from [facility] and we reviewed this content thoroughly [email or web address]  Assessment/Plan:  72 y.o. male presenting for annual physical.  Health Maintenance counseling: 1. Anticipatory guidance: Patient counseled regarding regular dental exams -q6 months, eye exams -yearly, wearing seatbelts.  2. Risk factor reduction:  Advised patient of need for regular exercise and diet rich and fruits and vegetables  to reduce risk of heart attack and stroke.  Exercise- active in yard and around house- advised regular exercise. Diet-weight up a few lbs from last visit- has been eating out fair amount- we discussed healthier eating.  Wt Readings from Last 3 Encounters:  03/19/18 228 lb 3.2 oz (103.5 kg)  07/04/17 225 lb 12.8 oz (102.4 kg)  05/29/17 224 lb (101.6 kg)  3. Immunizations/screenings/ancillary studies- - We discussed shingrix availability issues  as well as coverage issues (part D medicare)- I recommended that patient get vaccine at the pharmacy  Immunization History  Administered Date(s) Administered  . Pneumococcal Conjugate-13 03/19/2018  . Pneumococcal Polysaccharide-23 01/10/2012  . Tdap 07/01/2014  . Zoster 05/02/2009  4. Prostate cancer screening-  BPH on exam in past. Nocturia 3x a night but does drink a lot of fluids though.  We discussed usptf guidelines to screen through age 36- he would like to screen through 75 due to BPH.  Lab Results  Component Value Date   PSA 2.22 07/12/2015   PSA 2.37 07/01/2014   PSA 2.23 01/14/2013   5. Colon cancer screening - cologuard 2018 with 3 year repeat and that may be his last- no history colon polyps.  6. Skin cancer screening- Dr. [place] Crane yearly- scheduled this month. advised regular sunscreen use. Denies worrisome, changing, or new skin lesions.    Status of chronic or acute concerns   Wart right hand - treated with cryotherapy- sees dermatology in a month- I suspect he may need a refreeze at that time  Bilirubin high per baseline- gilberts  cardiology evaluation last year including echocardiogram (grade 2 diastolic) and heart monitor  For 2 weeks(PVCs, bigeminy at times, no a fib, no significant ventricular ectopy). Still with occasional dizziness- could be associated with High BP.    When he hasn't eaten- gets shaky- will check CBG- wife worried about diabetes. Advised regular meals  Pulled off tick yesterday- present for a few days.  Advised symptoms of RMSF and Lyme to look out for as well sa return precautions  Hypertension, essential S: controlled poorly on no rx  Home #s were up 160s or so when he checked it.  BP Readings from Last 3 Encounters:  03/19/18 (!) 140/92  09/02/17 125/74  07/04/17 (!) 148/92  A/P: We discussed blood pressure goal of <140/90. New diagnosis. Start amlodipine 2.5mg - follow up in 2-4 weeks for blood pressure recheck  Cough S: Had cough for months after next visit then resolved. Cough came back in the spring. Gets some wheezing type sounds at night that go away with cough. Has regular AM cough- coughs up yellow phlegm then feels better- restarts at night.  A/P: no history of allergies but has been bad allergy season- will treat for potential allergies- Trial zyrtec and flonase together until 2 weeks to see if this helps with your cough/congestion. We can recheck when he see him for BP   Hyperlipidemia have advised statin in past and he declinesd he agreed to start when Dr. Claiborne Billings recommended last  year during cardiology evaluation    No future appointments. No follow-ups on file.  Lab/Order associations: Need for prophylactic vaccination against Streptococcus pneumoniae (pneumococcus) - Plan: Pneumococcal conjugate vaccine 13-valent  Hypertension, essential  Cough  Hyperlipidemia, unspecified hyperlipidemia type  Meds ordered this encounter  Medications  . amLODipine (NORVASC) 2.5 MG tablet    Sig: Take 1 tablet (2.5 mg total) by mouth daily.    Dispense:  30 tablet    Refill:  5    Return precautions advised.  Garret Reddish, MD

## 2018-03-19 NOTE — Addendum Note (Signed)
Addended by: Marin Olp on: 03/19/2018 11:26 AM   Modules accepted: Orders

## 2018-03-19 NOTE — Assessment & Plan Note (Signed)
S: controlled poorly on no rx  Home #s were up 160s or so when he checked it.  BP Readings from Last 3 Encounters:  03/19/18 (!) 140/92  09/02/17 125/74  07/04/17 (!) 148/92  A/P: We discussed blood pressure goal of <140/90. New diagnosis. Start amlodipine 2.5mg - follow up in 2-4 weeks for blood pressure recheck

## 2018-03-20 ENCOUNTER — Other Ambulatory Visit: Payer: Self-pay

## 2018-03-20 DIAGNOSIS — Z125 Encounter for screening for malignant neoplasm of prostate: Secondary | ICD-10-CM

## 2018-04-07 DIAGNOSIS — Z1283 Encounter for screening for malignant neoplasm of skin: Secondary | ICD-10-CM | POA: Diagnosis not present

## 2018-04-07 DIAGNOSIS — L570 Actinic keratosis: Secondary | ICD-10-CM | POA: Diagnosis not present

## 2018-04-07 DIAGNOSIS — S30861A Insect bite (nonvenomous) of abdominal wall, initial encounter: Secondary | ICD-10-CM | POA: Diagnosis not present

## 2018-04-07 DIAGNOSIS — B078 Other viral warts: Secondary | ICD-10-CM | POA: Diagnosis not present

## 2018-04-07 DIAGNOSIS — D225 Melanocytic nevi of trunk: Secondary | ICD-10-CM | POA: Diagnosis not present

## 2018-04-07 DIAGNOSIS — X32XXXD Exposure to sunlight, subsequent encounter: Secondary | ICD-10-CM | POA: Diagnosis not present

## 2018-04-16 ENCOUNTER — Ambulatory Visit (INDEPENDENT_AMBULATORY_CARE_PROVIDER_SITE_OTHER): Payer: Medicare Other | Admitting: Family Medicine

## 2018-04-16 ENCOUNTER — Encounter: Payer: Self-pay | Admitting: Family Medicine

## 2018-04-16 VITALS — BP 136/84 | HR 61 | Temp 97.8°F | Ht 73.0 in | Wt 225.2 lb

## 2018-04-16 DIAGNOSIS — E785 Hyperlipidemia, unspecified: Secondary | ICD-10-CM

## 2018-04-16 DIAGNOSIS — R001 Bradycardia, unspecified: Secondary | ICD-10-CM

## 2018-04-16 DIAGNOSIS — I10 Essential (primary) hypertension: Secondary | ICD-10-CM | POA: Diagnosis not present

## 2018-04-16 DIAGNOSIS — Z125 Encounter for screening for malignant neoplasm of prostate: Secondary | ICD-10-CM | POA: Diagnosis not present

## 2018-04-16 NOTE — Patient Instructions (Signed)
I would also like for you to sign up for an annual wellness visit with one of our nurses, Cassie or Manuela Schwartz, who both specialize in the annual wellness visit. This is a free benefit under medicare that may help [place] find additional ways to help you. Some highlights are reviewing medications, lifestyle, and doing a dementia screen.   Please check with your pharmacy to see if they have the shingrix vaccine. If they do- please get this immunization and update [place] by phone call or mychart with dates you receive the vaccine  Please stop by the lab before you go.

## 2018-04-16 NOTE — Progress Notes (Signed)
Subjective:  Henry Kirk is a 72 y.o. year old very pleasant male patient who presents for/with See problem oriented charting ROS- No chest pain or shortness of breath. No headache or blurry vision.  Occasional lightheadedness   Past Medical History-  Patient Active Problem List   Diagnosis Date Noted  . Bradycardia 04/16/2018    Priority: Medium  . Hypertension, essential 03/19/2018    Priority: Medium  . Essential tremor 07/01/2014    Priority: Medium  . 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 Hyperlipidemia 04/25/2009    Priority: Medium  . Prostate cancer screening 07/01/2014    Priority: Low  . Other malaise and fatigue 04/26/2009    Priority: Low  . Cough 03/19/2018    Medications- reviewed and updated Current Outpatient Medications  Medication Sig Dispense Refill  . amLODipine (NORVASC) 2.5 MG tablet Take 1 tablet (2.5 mg total) by mouth daily. 30 tablet 5  . doxycycline (VIBRAMYCIN) 100 MG capsule TK 1 C PO BID FOR 21 DAYS WITH FOOD / WATER - USE CAUTION IN SUN  1  . rosuvastatin (CRESTOR) 20 MG tablet TAKE 1 TABLET(20 MG) BY MOUTH DAILY 30 tablet 6   No current facility-administered medications for this visit.     Objective: BP 136/84 (BP Location: Left Arm, Patient Position: Sitting, Cuff Size: Normal)   Pulse 61   Temp 97.8 F (36.6 C) (Oral)   Ht 6\' 1"  (1.854 m)   Wt 225 lb 3.2 oz (102.2 kg)   SpO2 95%   BMI 29.71 kg/m  Gen: NAD, resting comfortably CV: RRR (back to back beats can be perceived as bradycardia) no rubs or gallops Lungs: CTAB no crackles, wheeze, rhonchi Abdomen: soft/nontender/nondistended/normal bowel sounds. No rebound or guarding.  Ext: no edema Skin: warm, dry Neuro: grossly normal, moves all extremities  EKG: sinus rhythm but with ventricular bigeminy with rate 61, normal axis, normal intervals, no hypertrophy, no st or t wave changes  Assessment/Plan:  Hypertension, essential S: controlled on amlodipine  2.5mg . Home #s averaging <140/90 though does have occasional readings into 150s. With my repeat today BP did elevate even higher than this and home cuff corresponded to elevation (has some white coat hypertension with me in room) BP Readings from Last 3 Encounters:  04/16/18 136/84  03/19/18 (!) 140/92  09/02/17 125/74  A/P: We discussed blood pressure goal of <140/90. Continue current meds of amlodipine 2.5mg . Follow up 3 months. Check BP 2x a week or more frequently if goes above 140/90- see me sooner than 3 months if running high consistently.    Home cuff verified today  Hyperlipidemia S:  controlled on rosuvastatin 20mg  last visit. He is holding it short term while on an antibiotic (told him he could take it along with antibiotic though) Lab Results  Component Value Date   CHOL 121 03/19/2018   HDL 36.40 (L) 03/19/2018   LDLCALC 72 03/19/2018   LDLDIRECT 177.5 01/14/2013   TRIG 60.0 03/19/2018   CHOLHDL 3 03/19/2018   A/P: continue current rx- reasonable to keep LDL under 100.   Bradycardia Bradycardia on exam due to bigeminy. Has had extensive cardiac workup which showed this. echocardiogram reassuring other than grade 2 diastolic dysfunction, mild to moderate TR. low risk myoview last year as well  Prostate cancer screening Check psa at 3 month follow up Lab Results  Component Value Date   PSA 3.65 03/19/2018   PSA 2.22 07/12/2015   PSA 2.37 07/01/2014  Return in about 3 months (around 07/17/2018).  Lab/Order associations: Bradycardia - Plan: EKG 12-Lead  Hypertension, essential  Hyperlipidemia, unspecified hyperlipidemia type  Prostate cancer screening  Return precautions advised.  Garret Reddish, MD

## 2018-04-16 NOTE — Assessment & Plan Note (Signed)
S: controlled on amlodipine 2.5mg . Home #s averaging <140/90 though does have occasional readings into 150s. With my repeat today BP did elevate even higher than this and home cuff corresponded to elevation (has some white coat hypertension with me in room) BP Readings from Last 3 Encounters:  04/16/18 136/84  03/19/18 (!) 140/92  09/02/17 125/74  A/P: We discussed blood pressure goal of <140/90. Continue current meds of amlodipine 2.5mg . Follow up 3 months. Check BP 2x a week or more frequently if goes above 140/90- see me sooner than 3 months if running high consistently.    Home cuff verified today

## 2018-04-16 NOTE — Assessment & Plan Note (Signed)
Bradycardia on exam due to bigeminy. Has had extensive cardiac workup which showed this. echocardiogram reassuring other than grade 2 diastolic dysfunction, mild to moderate TR. low risk myoview last year as well

## 2018-04-16 NOTE — Assessment & Plan Note (Signed)
Check psa at 3 month follow up Lab Results  Component Value Date   PSA 3.65 03/19/2018   PSA 2.22 07/12/2015   PSA 2.37 07/01/2014

## 2018-04-16 NOTE — Assessment & Plan Note (Signed)
S:  controlled on rosuvastatin 20mg  last visit. He is holding it short term while on an antibiotic (told him he could take it along with antibiotic though) Lab Results  Component Value Date   CHOL 121 03/19/2018   HDL 36.40 (L) 03/19/2018   LDLCALC 72 03/19/2018   LDLDIRECT 177.5 01/14/2013   TRIG 60.0 03/19/2018   CHOLHDL 3 03/19/2018   A/P: continue current rx- reasonable to keep LDL under 100.

## 2018-04-22 ENCOUNTER — Ambulatory Visit: Payer: Medicare Other | Admitting: Family Medicine

## 2018-07-01 ENCOUNTER — Other Ambulatory Visit: Payer: Self-pay | Admitting: Cardiovascular Disease

## 2018-07-03 DIAGNOSIS — D225 Melanocytic nevi of trunk: Secondary | ICD-10-CM | POA: Diagnosis not present

## 2018-07-03 DIAGNOSIS — X32XXXD Exposure to sunlight, subsequent encounter: Secondary | ICD-10-CM | POA: Diagnosis not present

## 2018-07-03 DIAGNOSIS — L820 Inflamed seborrheic keratosis: Secondary | ICD-10-CM | POA: Diagnosis not present

## 2018-07-03 DIAGNOSIS — L859 Epidermal thickening, unspecified: Secondary | ICD-10-CM | POA: Diagnosis not present

## 2018-07-03 DIAGNOSIS — L570 Actinic keratosis: Secondary | ICD-10-CM | POA: Diagnosis not present

## 2018-09-12 ENCOUNTER — Other Ambulatory Visit: Payer: Self-pay

## 2018-09-12 MED ORDER — AMLODIPINE BESYLATE 2.5 MG PO TABS: 2.5000 mg | ORAL_TABLET | Freq: Every day | ORAL | 5 refills | Status: DC

## 2019-02-01 ENCOUNTER — Other Ambulatory Visit: Payer: Self-pay | Admitting: Cardiovascular Disease

## 2019-03-14 ENCOUNTER — Other Ambulatory Visit: Payer: Self-pay | Admitting: Family Medicine

## 2019-03-16 NOTE — Telephone Encounter (Signed)
Spoke to pt to scheduled a f/u visit. Pt wants to know if he can just do his AWV instead of just schdeuling a visit for HTN. Okay to set pt up for AWV? If so, what time?

## 2019-03-16 NOTE — Telephone Encounter (Signed)
Patient need to schedule an ov for more refills. 

## 2019-03-16 NOTE — Telephone Encounter (Signed)
You can schedule awv - do it in 40 minute slot- as long as in 40 mins this week- I dont mind when it iss.

## 2019-03-19 NOTE — Telephone Encounter (Signed)
Called pt and left message with wife to have him call the office.   Please scheduled pt for AWV.   Thanks!

## 2019-03-20 NOTE — Telephone Encounter (Signed)
TALKED TO THE PATIENT AND SAID WIFE WOULD CALL BACK 03/24/19 AND SCHEDULE APPT.

## 2019-04-13 ENCOUNTER — Ambulatory Visit (INDEPENDENT_AMBULATORY_CARE_PROVIDER_SITE_OTHER): Payer: Medicare Other | Admitting: Family Medicine

## 2019-04-13 ENCOUNTER — Encounter: Payer: Self-pay | Admitting: Family Medicine

## 2019-04-13 ENCOUNTER — Other Ambulatory Visit: Payer: Self-pay

## 2019-04-13 VITALS — BP 168/94 | HR 59 | Temp 97.8°F | Ht 73.0 in | Wt 228.4 lb

## 2019-04-13 DIAGNOSIS — I10 Essential (primary) hypertension: Secondary | ICD-10-CM | POA: Diagnosis not present

## 2019-04-13 DIAGNOSIS — E785 Hyperlipidemia, unspecified: Secondary | ICD-10-CM

## 2019-04-13 DIAGNOSIS — R972 Elevated prostate specific antigen [PSA]: Secondary | ICD-10-CM | POA: Diagnosis not present

## 2019-04-13 DIAGNOSIS — Z0000 Encounter for general adult medical examination without abnormal findings: Secondary | ICD-10-CM | POA: Diagnosis not present

## 2019-04-13 DIAGNOSIS — E669 Obesity, unspecified: Secondary | ICD-10-CM

## 2019-04-13 MED ORDER — AMLODIPINE BESYLATE 5 MG PO TABS: 5.0000 mg | ORAL_TABLET | Freq: Every day | ORAL | 3 refills | Status: DC

## 2019-04-13 NOTE — Assessment & Plan Note (Signed)
Hypertension- took amlodipine 2.5mg  last evening. Home #s high 140s over 70s. . Elevated today- will titrate to 5mg  and he will update [place] in about a month with home #s since we have verified his home cuff already.

## 2019-04-13 NOTE — Progress Notes (Signed)
Phone: [phone]   Subjective:  Patient presents today for their annual physical. Chief complaint-noted.   See problem oriented charting- ROS- full  review of systems was completed and negative except for:  Sparing cough (much improved)  The following were reviewed and entered/updated in epic: Past Medical History:  Diagnosis Date  . Elevated PSA    with prostatitis  . Gilbert's syndrome   . Hemorrhoids, external   . Hyperlipidemia   . Spermatic cord cyst    Patient Active Problem List   Diagnosis Date Noted  . Bradycardia 04/16/2018    Priority: Medium  . Hypertension, essential 03/19/2018    Priority: Medium  . Essential tremor 07/01/2014    Priority: Medium  . 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 Hyperlipidemia 04/25/2009    Priority: Medium  . Prostate cancer screening 07/01/2014    Priority: Low  . Other malaise and fatigue 04/26/2009    Priority: Low  . Cough 03/19/2018   Past Surgical History:  Procedure Laterality Date  . CATARACT EXTRACTION W/PHACO Left 06/03/2017   Procedure: CATARACT EXTRACTION PHACO AND INTRAOCULAR LENS PLACEMENT (IOC);  Surgeon: Tonny Branch, MD;  Location: [facility];  Service: Ophthalmology;  Laterality: Left;  CDE: 15.27  . CATARACT EXTRACTION W/PHACO Right 09/02/2017   Procedure: CATARACT EXTRACTION PHACO AND INTRAOCULAR LENS PLACEMENT RIGHT EYE;  Surgeon: Tonny Branch, MD;  Location: [facility];  Service: Ophthalmology;  Laterality: Right;  CDE: 7.92  . HAND SURGERY  2005   "lumps on head"    Family History  Problem Relation Age of Onset  . Cancer Father        spinal 53  . Cancer Son        testicular cancer  . Cancer Paternal Grandfather        spinal 48  . Prostate cancer Maternal Uncle        did not die of    Medications- reviewed and updated Current Outpatient Medications  Medication Sig Dispense Refill  . amLODipine (NORVASC) 5 MG tablet Take 1 tablet (5 mg total) by mouth daily. 90 tablet 3  .  rosuvastatin (CRESTOR) 20 MG tablet TAKE 1 TABLET(20 MG) BY MOUTH DAILY 30 tablet 6   No current facility-administered medications for this visit.     Allergies-reviewed and updated No Known Allergies  Social History   Social History Narrative   Married 1966. 2 sons Randall Hiss, not married and no kids, and [place] with 1 daughter, not married. All children live at home.       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       Hobbies: yardwork, working at [employer].    Objective  Objective:  BP (!) 168/94   Pulse (!) 59   Temp 97.8 F (36.6 C) (Oral)   Ht 6\' 1"  (1.854 m)   Wt 228 lb 6.4 oz (103.6 kg)   SpO2 98%   BMI 30.13 kg/m  Gen: NAD, resting comfortably HEENT: Mucous membranes are moist. Oropharynx normal Neck: no thyromegaly CV: RRR no murmurs rubs or gallops Lungs: CTAB no crackles, wheeze, rhonchi Abdomen: soft/nontender/nondistended/normal bowel sounds. No rebound or guarding.  Ext: trace edema (on amlodipine) Skin: warm, dry Neuro: grossly normal, moves all extremities, PERRLA   Assessment and Plan  73 y.o. male presenting for annual physical.  Health Maintenance counseling: 1. Anticipatory guidance: Patient counseled regarding regular dental exams -q6 months- pushed back some with covid 19, eye exams -yearly advised (  he goes prn),  avoiding smoking and second hand smoke , limiting alcohol to 2 beverages per day - no acohol.   2. Risk factor reduction:  Advised patient of need for regular exercise and diet rich and fruits and vegetables to reduce risk of heart attack and stroke. Exercise- trying to be active around house- push mows some- advised to increase his exercise levels. Diet-reasonably healthy- would love to see even just 5 lbs weight loss over next 6 months.  Wt Readings from Last 3 Encounters:  04/13/19 228 lb 6.4 oz (103.6 kg)  04/16/18 225 lb 3.2 oz (102.2 kg)  03/19/18 228 lb 3.2 oz (103.5 kg)  3.  Immunizations/screenings/ancillary studies- hold off on shingrix right now due to overlap of side effects with covid 19 symptoms Immunization History  Administered Date(s) Administered  . Pneumococcal Conjugate-13 03/19/2018  . Pneumococcal Polysaccharide-23 01/10/2012  . Tdap 07/01/2014  . Zoster 05/02/2009  4. Prostate cancer screening- plan was for 3-6 month repeat after last PSA but does not appear it was set up- will update psa and rectal exam Lab Results  Component Value Date   PSA 3.65 03/19/2018   PSA 2.22 07/12/2015   PSA 2.37 07/01/2014   5. Colon cancer screening - cologuard 2018- he will need one more and if negative will not require further cologuard 6. Skin cancer screening- Dr. Nevada Crane dermatology- sees tomorrow. advised regular sunscreen use. Denies worrisome, changing, or new skin lesions.  7. never smoker  Status of chronic or acute concerns   Chronic cough (minimal at this point)- seems to be doing much better lately- was worse beginning of year but has cycled down- worse when allergies are bad.   Hypertension- took amlodipine 2.5mg  last evening. Home #s high 140s over 70s. Elevated today- will titrate to 5mg  and he will update [place] in about a month with home #s since we have verified his home cuff already.   Hyperlipidemia- doing well on rosuvastatin 20mg  daily- update lipid panel today  Lab/Order associations: not fasting    ICD-10-CM   1. Preventative health care  Z00.00 CBC    Comprehensive metabolic panel    Lipid panel    PSA  2. Hyperlipidemia, unspecified hyperlipidemia type  E78.5 CBC    Comprehensive metabolic panel    Lipid panel  3. Elevated PSA  R97.20 PSA  4. Hypertension, essential  I10     Meds ordered this encounter  Medications  . amLODipine (NORVASC) 5 MG tablet    Sig: Take 1 tablet (5 mg total) by mouth daily.    Dispense:  90 tablet    Refill:  3   Return precautions advised.  Garret Reddish, MD

## 2019-04-13 NOTE — Patient Instructions (Addendum)
Great to see you today!  Lets increase amlodipine to 5 mg-goal blood pressure at home should be less than 140/90-you agreed to update [place] within a month with how your blood pressures are doing-if they remain above goal then I would likely add a different medication so we do not increase your swelling too much   Please stop by lab before you go If you do not have mychart- we will call you about results within 5 business days of [place] receiving them.  If you have mychart- we will send your results within 3 business days of [place] receiving them.  If abnormal or we want to clarify a result, we will call or mychart you to make sure you receive the message.  If you have questions or concerns or don't hear within 5-7 days, please send [place] a message or call us.

## 2019-04-14 DIAGNOSIS — L820 Inflamed seborrheic keratosis: Secondary | ICD-10-CM | POA: Diagnosis not present

## 2019-04-14 DIAGNOSIS — X32XXXD Exposure to sunlight, subsequent encounter: Secondary | ICD-10-CM | POA: Diagnosis not present

## 2019-04-14 DIAGNOSIS — D225 Melanocytic nevi of trunk: Secondary | ICD-10-CM | POA: Diagnosis not present

## 2019-04-14 DIAGNOSIS — L821 Other seborrheic keratosis: Secondary | ICD-10-CM | POA: Diagnosis not present

## 2019-04-14 DIAGNOSIS — L570 Actinic keratosis: Secondary | ICD-10-CM | POA: Diagnosis not present

## 2019-04-14 LAB — COMPREHENSIVE METABOLIC PANEL
ALT: 18 U/L (ref 0–53)
AST: 24 U/L (ref 0–37)
Albumin: 4.2 g/dL (ref 3.5–5.2)
Alkaline Phosphatase: 65 U/L (ref 39–117)
BUN: 10 mg/dL (ref 6–23)
CO2: 32 mEq/L (ref 19–32)
Calcium: 9.1 mg/dL (ref 8.4–10.5)
Chloride: 103 mEq/L (ref 96–112)
Creatinine, Ser: 1.01 mg/dL (ref 0.40–1.50)
GFR: 72.26 mL/min (ref >60.00)
Glucose, Bld: 85 mg/dL (ref 70–99)
Potassium: 4.2 mEq/L (ref 3.5–5.1)
Sodium: 142 mEq/L (ref 135–145)
Total Bilirubin: 1.5 mg/dL — ABNORMAL HIGH (ref 0.2–1.2)
Total Protein: 6.9 g/dL (ref 6.0–8.3)

## 2019-04-14 LAB — LIPID PANEL
Cholesterol: 127 mg/dL (ref 0–200)
HDL: 44.8 mg/dL (ref >39.00)
LDL Cholesterol: 55 mg/dL (ref 0–99)
NonHDL: 82.57
Total CHOL/HDL Ratio: 3
Triglycerides: 138 mg/dL (ref 0.0–149.0)
VLDL: 27.6 mg/dL (ref 0.0–40.0)

## 2019-04-14 LAB — CBC
HCT: 43.7 % (ref 39.0–52.0)
Hemoglobin: 15.2 g/dL (ref 13.0–17.0)
MCHC: 34.9 g/dL (ref 30.0–36.0)
MCV: 92.8 fl (ref 78.0–100.0)
Platelets: 252 10*3/uL (ref 150.0–400.0)
RBC: 4.71 Mil/uL (ref 4.22–5.81)
RDW: 13.1 % (ref 11.5–15.5)
WBC: 5.9 10*3/uL (ref 4.0–10.5)

## 2019-04-14 LAB — PSA: PSA: 3.4 ng/mL (ref 0.10–4.00)

## 2019-06-30 ENCOUNTER — Telehealth: Payer: Self-pay | Admitting: Family Medicine

## 2019-06-30 NOTE — Telephone Encounter (Signed)
I attempted to speak to patient about scheduling to discuss his concerns with another [facility] provider as Dr. Yong Channel is booked. He declined scheduling any appointment and just would like to know what to do regarding his medication.   Please contact patient to advise. Theadora Rama is aware   Copied from [place] [phone]. Topic: General - Inquiry >> Jun 30, 2019  3:33 PM Henry Kirk, Henry Kirk wrote: Reason for CRM: Patient called in stating he is no longer doubling BP medication as he felt it was causing the swelling in his feet. Patient stated his BP is back to 130s/70s with normal 1 dose. However if PCP is wanting to still do double dose to add an antiinflammatory medication. Please advise.

## 2019-06-30 NOTE — Telephone Encounter (Signed)
Forwarding to Dr. Hunter to advise.  

## 2019-06-30 NOTE — Telephone Encounter (Signed)
Since blood pressure is much improved- I am fine with him taking 2.5 mg- you can send in amlodipine 2.5 mg #90 with 3 refills .  He may take half tablet of 5 mg pills of amlodipine until he runs out if he would like to use current supply.  Please tell him thank you for the update.

## 2019-07-01 MED ORDER — AMLODIPINE BESYLATE 2.5 MG PO TABS: 2.5000 mg | ORAL_TABLET | Freq: Every day | ORAL | 3 refills | Status: DC

## 2019-07-01 NOTE — Telephone Encounter (Signed)
Called pt and advised. Rx sent to pharmacy.

## 2019-07-01 NOTE — Addendum Note (Signed)
Addended by: Jasper Loser on: 07/01/2019 10:44 AM   Modules accepted: Orders

## 2019-07-20 ENCOUNTER — Telehealth: Payer: Self-pay | Admitting: Family Medicine

## 2019-07-20 NOTE — Telephone Encounter (Signed)
I left a message asking the patient to call and schedule Medicare AWV with Courtney ([facility]).  If patient calls back, please schedule Medicare Wellness Visit at next available opening.  VDM (Dee-Dee) °

## 2019-10-01 ENCOUNTER — Other Ambulatory Visit: Payer: Self-pay

## 2019-10-01 MED ORDER — ROSUVASTATIN CALCIUM 20 MG PO TABS: ORAL_TABLET | ORAL | 6 refills | Status: DC

## 2019-11-19 ENCOUNTER — Telehealth: Payer: Self-pay | Admitting: Family Medicine

## 2019-11-19 NOTE — Telephone Encounter (Signed)
I called the patient to schedule AWV with Courtney, but he declined. °

## 2020-04-21 ENCOUNTER — Telehealth: Payer: Self-pay

## 2020-04-21 MED ORDER — ROSUVASTATIN CALCIUM 20 MG PO TABS: ORAL_TABLET | ORAL | 5 refills | Status: DC

## 2020-04-21 NOTE — Telephone Encounter (Signed)
Rx sent to pharmacy   

## 2020-04-21 NOTE — Telephone Encounter (Signed)
..   LAST APPOINTMENT DATE: 11/19/2019   NEXT APPOINTMENT DATE:@8 /09/2020  MEDICATION:rosuvastatin (CRESTOR) 20 MG tablet [[id number]]    PHARMACY: [id number] - [place],  - [address] AT [address]  **Let patient know to contact pharmacy at the end of the day to make sure medication is ready. **  ** Please notify patient to allow 48-72 hours to process**  **Encourage patient to contact the pharmacy for refills or they can request refills through [facility]**  CLINICAL FILLS OUT ALL BELOW:   LAST REFILL:  QTY:  REFILL DATE:    OTHER COMMENTS: Pt was receiving a 90 day supply but [facility] just notified him that he can only get them in a 30 count now. Therefore, the pt will run about before his physical. He is requesting a 90 day supply if possible   Okay for refill?  Please advise

## 2020-05-18 NOTE — Progress Notes (Signed)
Phone: [phone]   Subjective:  Patient presents today for their annual physical. Chief complaint-noted.   See problem oriented charting- Review of Systems  Constitutional: Negative for chills and fever.  HENT: Negative for ear pain, hearing loss and tinnitus.   Eyes: Negative for blurred vision and double vision.  Respiratory: Negative for cough, shortness of breath and wheezing.   Cardiovascular: Negative for chest pain, palpitations and leg swelling.  Gastrointestinal: Negative for heartburn, nausea and vomiting.  Genitourinary: Negative for dysuria, frequency and urgency.       Nocturia increased recently   Musculoskeletal: Negative for back pain, joint pain and neck pain.  Skin: Negative for rash.       Two small areas one on right fore arm and left hand. Has not seen dermatology this year.   Neurological: Negative for dizziness, seizures and headaches.  Endo/Heme/Allergies: Does not bruise/bleed easily.  Psychiatric/Behavioral: Negative for depression, memory loss and suicidal ideas. The patient does not have insomnia.     The following were reviewed and entered/updated in epic: Past Medical History:  Diagnosis Date  . Elevated PSA    with prostatitis  . Gilbert's syndrome   . Hemorrhoids, external   . Hyperlipidemia   . Spermatic cord cyst    Patient Active Problem List   Diagnosis Date Noted  . Aortic atherosclerosis ([place]) 05/19/2020    Priority: Medium  . Bradycardia 04/16/2018    Priority: Medium  . Hypertension, essential 03/19/2018    Priority: Medium  . Essential tremor 07/01/2014    Priority: Medium  . 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 Hyperlipidemia 04/25/2009    Priority: Medium  . Prostate cancer screening 07/01/2014    Priority: Low  . Other malaise and fatigue 04/26/2009    Priority: Low  . Cough 03/19/2018   Past Surgical History:  Procedure Laterality Date  . CATARACT EXTRACTION W/PHACO Left 06/03/2017   Procedure:  CATARACT EXTRACTION PHACO AND INTRAOCULAR LENS PLACEMENT (IOC);  Surgeon: Tonny Branch, MD;  Location: [facility];  Service: Ophthalmology;  Laterality: Left;  CDE: 15.27  . CATARACT EXTRACTION W/PHACO Right 09/02/2017   Procedure: CATARACT EXTRACTION PHACO AND INTRAOCULAR LENS PLACEMENT RIGHT EYE;  Surgeon: Tonny Branch, MD;  Location: [facility];  Service: Ophthalmology;  Laterality: Right;  CDE: 7.92  . HAND SURGERY  2005   "lumps on head"    Family History  Problem Relation Age of Onset  . Cancer Father        spinal 38  . Cancer Son        testicular cancer  . Cancer Paternal Grandfather        spinal 32  . Prostate cancer Maternal Uncle        did not die of    Medications- reviewed and updated Current Outpatient Medications  Medication Sig Dispense Refill  . amLODipine (NORVASC) 2.5 MG tablet Take 1 tablet (2.5 mg total) by mouth daily. 90 tablet 3  . rosuvastatin (CRESTOR) 20 MG tablet TAKE 1 TABLET(20 MG) BY MOUTH DAILY 90 tablet 3  . hydrochlorothiazide (HYDRODIURIL) 25 MG tablet Take 1 tablet (25 mg total) by mouth daily. 90 tablet 3   No current facility-administered medications for this visit.    Allergies-reviewed and updated No Known Allergies  Social History   Social History Narrative   Married 1966. 2 sons Randall Hiss, not married and no kids, and Larkin Ina with 1 daughter- 73 in 2021, not married.      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 Very active working at home.       Hobbies: yardwork, working at [employer].    Objective  Objective:  BP (!) 152/92   Pulse 65   Temp 98.5 F (36.9 C) (Temporal)   Ht 6\' 1"  (1.854 m)   Wt 225 lb (102.1 kg)   SpO2 98%   BMI 29.69 kg/m  Gen: NAD, resting comfortably HEENT: Mucous membranes are moist. Oropharynx normal Neck: no thyromegaly CV: RRR no murmurs rubs or gallops Lungs: CTAB no crackles, wheeze, rhonchi Abdomen: soft/nontender/nondistended/normal bowel sounds. No rebound or guarding.  Ext: no  edema Skin: warm, dry Neuro: grossly normal, moves all extremities, PERRLA  Procedure note: Benefits and risks verbally discussed with patient. Verbal consent 10 second freeze thaw cycle of cryotherapy performed  with liquid nitrogen to left hand and right forearm.  No complications.  Patient tolerated the procedure well other than mild pain.     Assessment and Plan  74 y.o. male presenting for annual physical.  Health Maintenance counseling: 1. Anticipatory guidance: Patient counseled regarding regular dental exams q6 months recent root canal, eye exams -yearly advised- he has done well since cataracts and just going prn- no recent issues,  avoiding smoking and second hand smoke , limiting alcohol to 2 beverages per day - rarely drinks   2. Risk factor reduction:  Advised patient of need for regular exercise and diet rich and fruits and vegetables to reduce risk of heart attack and stroke. Exercise- does not exercise- states having trouble to get into groove . Trying to be active around the home at least.  Diet-down 3 lbs from last year- .tries to eat healthy diet.   Wt Readings from Last 3 Encounters:  05/19/20 225 lb (102.1 kg)  04/13/19 228 lb 6.4 oz (103.6 kg)  04/16/18 225 lb 3.2 oz (102.2 kg)  3. Immunizations/screenings/ancillary studies- recommended flu shot in the fall . Declines shingrix fo rnow Immunization History  Administered Date(s) Administered  . Moderna SARS-COVID-2 Vaccination 01/19/2020, 02/16/2020  . Pneumococcal Conjugate-13 03/19/2018  . Pneumococcal Polysaccharide-23 01/10/2012  . Tdap 07/01/2014  . Zoster 05/02/2009  4. Prostate cancer screening- nocturia more recently and with high PSA wants to do another level . From 2016 to 2019 had decent increase so we have opted to monitor plus with more nocturia want to make sure not trending up more.  -declines rectal- could do at 1-2 month follow up if needed Lab Results  Component Value Date   PSA 3.40 04/13/2019    PSA 3.65 03/19/2018   PSA 2.22 07/12/2015   5. Colon cancer screening - cologuard 2018 now due-  just completed home test will call with contact information so that I can get results. He wants to wait until next year on cologuard or possibly stop screening - sounds like fobt or fit testing 6. Skin cancer screening- advised regular sunscreen use. Denies worrisome, changing, or new skin lesions. Followed in past by dermatology. Has not been seen this year would like to have two areas looked at. Aks x2 as below 7. never smoker 8. STD screening - married- declines  Status of chronic or acute concerns   #hyperlipidemia/aortic atherosclerosis- incidental finding S: Medication: Rosuvastatin 20Mg   Lab Results  Component Value Date   CHOL 127 04/13/2019   HDL 44.80 04/13/2019   LDLCALC 55 04/13/2019   LDLDIRECT 177.5 01/14/2013   TRIG 138.0 04/13/2019   CHOLHDL 3 04/13/2019   A/P: controlled last check- update lipids  #  hypertension See problem oriented charting/separate visit due to poor control  #Actinic keratosis S: Has noted lesion on left hand as well as right forearm-slightly scaly and with erythematous base-1 on the hand he is recently noted along the forearm has been there for several months A/P: These appear to be actinic keratoses-we discussed precancerous nature and recommendation for cryotherapy-he verbally consents for this and cryotherapy was completed today  # chronic cough (comes and goes) cycles with allergies. CXR 2018 around respiratory illness. Recurs 2019 more seasonal- declines repeat CXR for now  # essential tremor- slightly more in left than right hand. Shaky if holds objects at times.   Recommended follow up: 1 month  Lab/Order associations: not fasting   ICD-10-CM   1. Hypertension, essential  I10   2. Preventative health care  Z00.00 CBC with Differential/Platelet    Comprehensive metabolic panel    Lipid panel    PSA  3. Hyperlipidemia, unspecified  hyperlipidemia type  E78.5 CBC with Differential/Platelet    Comprehensive metabolic panel    Lipid panel  4. Elevated PSA  R97.20 PSA  5. Encounter for screening colonoscopy  Z12.11   6. Essential tremor  G25.0   7. Screen for colon cancer  Z12.11   8. Aortic atherosclerosis (H[facility])  I70.0   9. Cough  R05     Meds ordered this encounter  Medications  . amLODipine (NORVASC) 2.5 MG tablet    Sig: Take 1 tablet (2.5 mg total) by mouth daily.    Dispense:  90 tablet    Refill:  3  . rosuvastatin (CRESTOR) 20 MG tablet    Sig: TAKE 1 TABLET(20 MG) BY MOUTH DAILY    Dispense:  90 tablet    Refill:  3  . hydrochlorothiazide (HYDRODIURIL) 25 MG tablet    Sig: Take 1 tablet (25 mg total) by mouth daily.    Dispense:  90 tablet    Refill:  3    Return precautions advised.  Garret Reddish, MD

## 2020-05-18 NOTE — Patient Instructions (Addendum)
Please stop by lab before you go If you have mychart- we will send your results within 3 business days of [place] receiving them.  If you do not have mychart- we will call you about results within 5 business days of [place] receiving them.  *please note we are currently using [facility] which has a longer processing time than [facility] typically so labs may not come back as quickly as in the past *please also note that you will see labs on mychart as soon as they post. I will later go in and write notes on them- will say "notes from Dr. Yong Channel"   BP remains high on repeat- discussed adding hydrochlorothiazide 25mg  in the morning and continue amlodipine 2.5mg  before bed. Follow up 1 month  Health Maintenance Due  Topic Date Due  . COVID-19 Vaccine (1) had done a few weeks ago will call and give information  Never done  . INFLUENZA VACCINE -please let us know when you have gotten this.  05/08/2020   Depression screen PHQ 2/9 04/13/2019 03/19/2018 03/19/2018  Decreased Interest 0 2 0  Down, Depressed, Hopeless 0 - 0  PHQ - 2 Score 0 2 0  Altered sleeping 0 0 -  Tired, decreased energy 0 0 -  Change in appetite 0 0 -  Feeling bad or failure about yourself  0 0 -  Trouble concentrating 0 0 -  Moving slowly or fidgety/restless 0 0 -  Suicidal thoughts 0 0 -  PHQ-9 Score 0 2 -  Difficult doing work/chores - Not difficult at all -    Cryosurgery for Skin Conditions, Care After These instructions give you information on caring for yourself after your procedure. Your doctor may also give you more specific instructions. Call your doctor if you have any problems or questions after your procedure. Follow these instructions at home: Caring for the treated area   Follow instructions from your doctor about how to take care of the treated area. Make sure you: ? Keep the area covered with a bandage (dressing) until it heals, or for as long as told by your doctor. ? Wash your hands with soap and water before you  change your bandage. If you do not have soap and water, use hand sanitizer. ? Change your bandage as told by your doctor. ? Keep the bandage and the treated area clean and dry. If the bandage gets wet, change it right away. ? Clean the treated area with soap and water.  Check the treated area every day for signs of infection. Check for: ? More redness, swelling, or pain. ? More fluid or blood. ? Warmth. ? Pus or a bad smell. General instructions  Do not pick at your blister. Do not try to break it open. This can cause infection and scarring.  Do not put any medicine, cream, or lotion on the treated area unless told by your doctor.  Take over-the-counter and prescription medicines only as told by your doctor.  Keep all follow-up visits as told by your doctor. This is important. Contact a doctor if:  You have more redness, swelling, or pain around the treated area.  You have more fluid or blood coming from the treated area.  The treated area feels warm to the touch.  You have pus or a bad smell coming from the treated area.  Your blister gets large and painful. Get help right away if:  You have a fever and have redness spreading from the treated area. Summary  You  should keep the treated area and your bandage clean and dry.  Check the treated area every day for signs of infection. Signs include fluid, pus, warmth, or having more redness, swelling, or pain.  Do not pick at your blister. Do not try to break it open. This information is not intended to replace advice given to you by your health care provider. Make sure you discuss any questions you have with your health care provider. Document Revised: 09/06/2017 Document Reviewed: 08/13/2016 Elsevier Patient Education  2020 [employer].

## 2020-05-19 ENCOUNTER — Encounter: Payer: Self-pay | Admitting: Family Medicine

## 2020-05-19 ENCOUNTER — Ambulatory Visit (INDEPENDENT_AMBULATORY_CARE_PROVIDER_SITE_OTHER): Payer: Medicare Other | Admitting: Family Medicine

## 2020-05-19 ENCOUNTER — Other Ambulatory Visit: Payer: Self-pay

## 2020-05-19 VITALS — BP 152/92 | HR 65 | Temp 98.5°F | Ht 73.0 in | Wt 225.0 lb

## 2020-05-19 DIAGNOSIS — R972 Elevated prostate specific antigen [PSA]: Secondary | ICD-10-CM | POA: Diagnosis not present

## 2020-05-19 DIAGNOSIS — I10 Essential (primary) hypertension: Secondary | ICD-10-CM | POA: Diagnosis not present

## 2020-05-19 DIAGNOSIS — Z0000 Encounter for general adult medical examination without abnormal findings: Secondary | ICD-10-CM | POA: Diagnosis not present

## 2020-05-19 DIAGNOSIS — Z1211 Encounter for screening for malignant neoplasm of colon: Secondary | ICD-10-CM

## 2020-05-19 DIAGNOSIS — G250 Essential tremor: Secondary | ICD-10-CM

## 2020-05-19 DIAGNOSIS — E785 Hyperlipidemia, unspecified: Secondary | ICD-10-CM

## 2020-05-19 DIAGNOSIS — L570 Actinic keratosis: Secondary | ICD-10-CM

## 2020-05-19 DIAGNOSIS — R05 Cough: Secondary | ICD-10-CM

## 2020-05-19 DIAGNOSIS — R059 Cough, unspecified: Secondary | ICD-10-CM

## 2020-05-19 DIAGNOSIS — I700 Atherosclerosis of aorta: Secondary | ICD-10-CM

## 2020-05-19 MED ORDER — HYDROCHLOROTHIAZIDE 25 MG PO TABS: 25.0000 mg | ORAL_TABLET | Freq: Every day | ORAL | 3 refills | Status: DC

## 2020-05-19 MED ORDER — ROSUVASTATIN CALCIUM 20 MG PO TABS: ORAL_TABLET | ORAL | 3 refills | Status: DC

## 2020-05-19 MED ORDER — AMLODIPINE BESYLATE 2.5 MG PO TABS: 2.5000 mg | ORAL_TABLET | Freq: Every day | ORAL | 3 refills | Status: DC

## 2020-05-19 NOTE — Assessment & Plan Note (Signed)
#  hypertension S: medication: Amlodipine 2.5Mg . had bad swelling on 5 mg amlodipine.  Home readings #s:  hasnt recently checked BP Readings from Last 3 Encounters:  05/19/20 (!) 152/92  04/13/19 (!) 168/94  04/16/18 136/84  A/P: BP remains high on repeat- discussed adding hydrochlorothiazide 25mg  in the morning and continue amlodipine 2.5mg  before bed. Follow up 1 month

## 2020-05-19 NOTE — Progress Notes (Signed)
  Phone [phone] In person visit   Subjective:   Zariah Jost is a 74 y.o. year old very pleasant male patient who presents for/with See problem oriented charting  This visit occurred during the SARS-CoV-2 public health emergency.  Safety protocols were in place, including screening questions prior to the visit, additional usage of staff PPE, and extensive cleaning of exam room while observing appropriate contact time as indicated for disinfecting solutions.   Past Medical History-  Patient Active Problem List   Diagnosis Date Noted  . Aortic atherosclerosis ([place]) 05/19/2020    Priority: Medium  . Bradycardia 04/16/2018    Priority: Medium  . Hypertension, essential 03/19/2018    Priority: Medium  . Essential tremor 07/01/2014    Priority: Medium  . 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 Hyperlipidemia 04/25/2009    Priority: Medium  . Prostate cancer screening 07/01/2014    Priority: Low  . Other malaise and fatigue 04/26/2009    Priority: Low  . Cough 03/19/2018    Medications- reviewed and updated Current Outpatient Medications  Medication Sig Dispense Refill  . amLODipine (NORVASC) 2.5 MG tablet Take 1 tablet (2.5 mg total) by mouth daily. 90 tablet 3  . rosuvastatin (CRESTOR) 20 MG tablet TAKE 1 TABLET(20 MG) BY MOUTH DAILY 90 tablet 3   No current facility-administered medications for this visit.     Objective:  BP (!) 152/92   Pulse 65   Temp 98.5 F (36.9 C) (Temporal)   Ht 6\' 1"  (1.854 m)   Wt 225 lb (102.1 kg)   SpO2 98%   BMI 29.69 kg/m  Gen: NAD, resting comfortably Repeat BP as above 152/92 down from initial systolic check in 902X    Assessment and Plan   Hypertension, essential #hypertension S: medication: Amlodipine 2.5Mg . had bad swelling on 5 mg amlodipine.  Home readings #s:  hasnt recently checked BP Readings from Last 3 Encounters:  05/19/20 (!) 152/92  04/13/19 (!) 168/94  04/16/18 136/84  A/P: BP remains high  on repeat- discussed adding hydrochlorothiazide 25mg  in the morning and continue amlodipine 2.5mg  before bed. Follow up 1 month  Recommended follow up: 1 to 36-month follow-up  Lab/Order associations:   ICD-10-CM   1. Hypertension, essential  I10     . hydrochlorothiazide (HYDRODIURIL) 25 MG tablet    Sig: Take 1 tablet (25 mg total) by mouth daily.    Dispense:  90 tablet    Refill:  3     Return precautions advised.  Garret Reddish, MD

## 2020-05-19 NOTE — Addendum Note (Signed)
Addended by: Liliane Channel on: 05/19/2020 03:54 PM   Modules accepted: Orders

## 2020-05-20 LAB — CBC WITH DIFFERENTIAL/PLATELET
Absolute Monocytes: 847 cells/uL (ref 200–950)
Basophils Absolute: 51 cells/uL (ref 0–200)
Basophils Relative: 0.7 %
Eosinophils Absolute: 548 cells/uL — ABNORMAL HIGH (ref 15–500)
Eosinophils Relative: 7.5 %
HCT: 45.4 % (ref 38.5–50.0)
Hemoglobin: 15.6 g/dL (ref 13.2–17.1)
Lymphs Abs: 1409 cells/uL (ref 850–3900)
MCH: 32.3 pg (ref 27.0–33.0)
MCHC: 34.4 g/dL (ref 32.0–36.0)
MCV: 94 fL (ref 80.0–100.0)
MPV: 9.7 fL (ref 7.5–12.5)
Monocytes Relative: 11.6 %
Neutro Abs: 4446 cells/uL (ref 1500–7800)
Neutrophils Relative %: 60.9 %
Platelets: 258 10*3/uL (ref 140–400)
RBC: 4.83 10*6/uL (ref 4.20–5.80)
RDW: 12.4 % (ref 11.0–15.0)
Total Lymphocyte: 19.3 %
WBC: 7.3 10*3/uL (ref 3.8–10.8)

## 2020-05-20 LAB — LIPID PANEL
Cholesterol: 130 mg/dL (ref <200)
HDL: 47 mg/dL (ref >=40)
LDL Cholesterol (Calc): 67 mg/dL (calc)
Non-HDL Cholesterol (Calc): 83 mg/dL (calc) (ref <130)
Total CHOL/HDL Ratio: 2.8 (calc) (ref <5.0)
Triglycerides: 80 mg/dL (ref <150)

## 2020-05-20 LAB — COMPREHENSIVE METABOLIC PANEL
AG Ratio: 1.6 (calc) (ref 1.0–2.5)
ALT: 20 U/L (ref 9–46)
AST: 26 U/L (ref 10–35)
Albumin: 4.4 g/dL (ref 3.6–5.1)
Alkaline phosphatase (APISO): 58 U/L (ref 35–144)
BUN/Creatinine Ratio: 15 (calc) (ref 6–22)
BUN: 20 mg/dL (ref 7–25)
CO2: 31 mmol/L (ref 20–32)
Calcium: 9.9 mg/dL (ref 8.6–10.3)
Chloride: 107 mmol/L (ref 98–110)
Creat: 1.34 mg/dL — ABNORMAL HIGH (ref 0.70–1.18)
Globulin: 2.7 g/dL (calc) (ref 1.9–3.7)
Glucose, Bld: 74 mg/dL (ref 65–99)
Potassium: 5 mmol/L (ref 3.5–5.3)
Sodium: 145 mmol/L (ref 135–146)
Total Bilirubin: 1.7 mg/dL — ABNORMAL HIGH (ref 0.2–1.2)
Total Protein: 7.1 g/dL (ref 6.1–8.1)

## 2020-05-20 LAB — PSA: PSA: 2.2 ng/mL (ref <=4.0)

## 2020-06-26 ENCOUNTER — Other Ambulatory Visit: Payer: Self-pay | Admitting: Family Medicine

## 2020-08-03 ENCOUNTER — Ambulatory Visit: Payer: Medicare Other | Admitting: Family Medicine

## 2021-01-13 ENCOUNTER — Ambulatory Visit (INDEPENDENT_AMBULATORY_CARE_PROVIDER_SITE_OTHER): Payer: Medicare Other

## 2021-01-13 ENCOUNTER — Other Ambulatory Visit: Payer: Self-pay

## 2021-01-13 VITALS — BP 145/75 | HR 57 | Temp 98.1°F | Resp 20 | Wt 221.6 lb

## 2021-01-13 DIAGNOSIS — Z0000 Encounter for general adult medical examination without abnormal findings: Secondary | ICD-10-CM | POA: Diagnosis not present

## 2021-01-13 MED ORDER — HYDROCHLOROTHIAZIDE 12.5 MG PO TABS: 12.5000 mg | ORAL_TABLET | Freq: Every day | ORAL | 3 refills | Status: DC

## 2021-01-13 NOTE — Patient Instructions (Signed)
Mr. Henry Kirk , Thank you for taking time to come for your Medicare Wellness Visit. I appreciate your ongoing commitment to your health goals. Please review the following plan we discussed and let me know if I can assist you in the future.   Screening recommendations/referrals: Colonoscopy: Done 04/17/17 Recommended yearly ophthalmology/optometry visit for glaucoma screening and checkup Recommended yearly dental visit for hygiene and checkup  Vaccinations: Influenza vaccine: Due and discussed Pneumococcal vaccine: Up to date Tdap vaccine: Up to date Shingles vaccine: Shingrix discussed. Please contact your pharmacy for coverage information.    Covid-19: Completed 4/13 & 02/16/20  Advanced directives: Advance directive discussed with you today. I have provided a copy for you to complete at home and have notarized. Once this is complete please bring a copy in to our office so we can scan it into your chart.  Conditions/risks identified: Stay Healthy and not go down hill   Next appointment: Follow up in one year for your annual wellness visit.   Preventive Care 76 Years and Older, Male Preventive care refers to lifestyle choices and visits with your health care provider that can promote health and wellness. What does preventive care include?  A yearly physical exam. This is also called an annual well check.  Dental exams once or twice a year.  Routine eye exams. Ask your health care provider how often you should have your eyes checked.  Personal lifestyle choices, including:  Daily care of your teeth and gums.  Regular physical activity.  Eating a healthy diet.  Avoiding tobacco and drug use.  Limiting alcohol use.  Practicing safe sex.  Taking low doses of aspirin every day.  Taking vitamin and mineral supplements as recommended by your health care provider. What happens during an annual well check? The services and screenings done by your health care provider during your  annual well check will depend on your age, overall health, lifestyle risk factors, and family history of disease. Counseling  Your health care provider may ask you questions about your:  Alcohol use.  Tobacco use.  Drug use.  Emotional well-being.  Home and relationship well-being.  Sexual activity.  Eating habits.  History of falls.  Memory and ability to understand (cognition).  Work and work Statistician. Screening  You may have the following tests or measurements:  Height, weight, and BMI.  Blood pressure.  Lipid and cholesterol levels. These may be checked every 5 years, or more frequently if you are over 76 years old.  Skin check.  Lung cancer screening. You may have this screening every year starting at age 76 if you have a 30-pack-year history of smoking and currently smoke or have quit within the past 15 years.  Fecal occult blood test (FOBT) of the stool. You may have this test every year starting at age 76.  Flexible sigmoidoscopy or colonoscopy. You may have a sigmoidoscopy every 5 years or a colonoscopy every 10 years starting at age 76.  Prostate cancer screening. Recommendations will vary depending on your family history and other risks.  Hepatitis C blood test.  Hepatitis B blood test.  Sexually transmitted disease (STD) testing.  Diabetes screening. This is done by checking your blood sugar (glucose) after you have not eaten for a while (fasting). You may have this done every 1-3 years.  Abdominal aortic aneurysm (AAA) screening. You may need this if you are a current or former smoker.  Osteoporosis. You may be screened starting at age 27 if you are at high  risk. Talk with your health care provider about your test results, treatment options, and if necessary, the need for more tests. Vaccines  Your health care provider may recommend certain vaccines, such as:  Influenza vaccine. This is recommended every year.  Tetanus, diphtheria, and  acellular pertussis (Tdap, Td) vaccine. You may need a Td booster every 10 years.  Zoster vaccine. You may need this after age 76.  Pneumococcal 13-valent conjugate (PCV13) vaccine. One dose is recommended after age 76.  Pneumococcal polysaccharide (PPSV23) vaccine. One dose is recommended after age 76. Talk to your health care provider about which screenings and vaccines you need and how often you need them. This information is not intended to replace advice given to you by your health care provider. Make sure you discuss any questions you have with your health care provider. Document Released: 10/21/2015 Document Revised: 06/13/2016 Document Reviewed: 07/26/2015 Elsevier Interactive Patient Education  2017 [place] Prevention in the Home Falls can cause injuries. They can happen to people of all ages. There are many things you can do to make your home safe and to help prevent falls. What can I do on the outside of my home?  Regularly fix the edges of walkways and driveways and fix any cracks.  Remove anything that might make you trip as you walk through a door, such as a raised step or threshold.  Trim any bushes or trees on the path to your home.  Use bright outdoor lighting.  Clear any walking paths of anything that might make someone trip, such as rocks or tools.  Regularly check to see if handrails are loose or broken. Make sure that both sides of any steps have handrails.  Any raised decks and porches should have guardrails on the edges.  Have any leaves, snow, or ice cleared regularly.  Use sand or salt on walking paths during winter.  Clean up any spills in your garage right away. This includes oil or grease spills. What can I do in the bathroom?  Use night lights.  Install grab bars by the toilet and in the tub and shower. Do not use towel bars as grab bars.  Use non-skid mats or decals in the tub or shower.  If you need to sit down in the shower, use  a plastic, non-slip stool.  Keep the floor dry. Clean up any water that spills on the floor as soon as it happens.  Remove soap buildup in the tub or shower regularly.  Attach bath mats securely with double-sided non-slip rug tape.  Do not have throw rugs and other things on the floor that can make you trip. What can I do in the bedroom?  Use night lights.  Make sure that you have a light by your bed that is easy to reach.  Do not use any sheets or blankets that are too big for your bed. They should not hang down onto the floor.  Have a firm chair that has side arms. You can use this for support while you get dressed.  Do not have throw rugs and other things on the floor that can make you trip. What can I do in the kitchen?  Clean up any spills right away.  Avoid walking on wet floors.  Keep items that you use a lot in easy-to-reach places.  If you need to reach something above you, use a strong step stool that has a grab bar.  Keep electrical cords out of the way.  Do not use floor polish or wax that makes floors slippery. If you must use wax, use non-skid floor wax.  Do not have throw rugs and other things on the floor that can make you trip. What can I do with my stairs?  Do not leave any items on the stairs.  Make sure that there are handrails on both sides of the stairs and use them. Fix handrails that are broken or loose. Make sure that handrails are as long as the stairways.  Check any carpeting to make sure that it is firmly attached to the stairs. Fix any carpet that is loose or worn.  Avoid having throw rugs at the top or bottom of the stairs. If you do have throw rugs, attach them to the floor with carpet tape.  Make sure that you have a light switch at the top of the stairs and the bottom of the stairs. If you do not have them, ask someone to add them for you. What else can I do to help prevent falls?  Wear shoes that:  Do not have high heels.  Have  rubber bottoms.  Are comfortable and fit you well.  Are closed at the toe. Do not wear sandals.  If you use a stepladder:  Make sure that it is fully opened. Do not climb a closed stepladder.  Make sure that both sides of the stepladder are locked into place.  Ask someone to hold it for you, if possible.  Clearly mark and make sure that you can see:  Any grab bars or handrails.  First and last steps.  Where the edge of each step is.  Use tools that help you move around (mobility aids) if they are needed. These include:  Canes.  Walkers.  Scooters.  Crutches.  Turn on the lights when you go into a dark area. Replace any light bulbs as soon as they burn out.  Set up your furniture so you have a clear path. Avoid moving your furniture around.  If any of your floors are uneven, fix them.  If there are any pets around you, be aware of where they are.  Review your medicines with your doctor. Some medicines can make you feel dizzy. This can increase your chance of falling. Ask your doctor what other things that you can do to help prevent falls. This information is not intended to replace advice given to you by your health care provider. Make sure you discuss any questions you have with your health care provider. Document Released: 07/21/2009 Document Revised: 03/01/2016 Document Reviewed: 10/29/2014 Elsevier Interactive Patient Education  2017 [employer].

## 2021-01-13 NOTE — Progress Notes (Addendum)
Subjective:   Henry Kirk is a 75 y.o. male who presents for Medicare Annual/Subsequent preventive examination.  Review of Systems     Cardiac Risk Factors include: advanced age (>42mn, >>74women);hypertension;dyslipidemia;male gender     Objective:    Today's Vitals   01/13/21 0930  BP: (!) 145/75  Pulse: (!) 57  Resp: 20  Temp: 98.1 F (36.7 C)  SpO2: 99%  Weight: 221 lb 9.6 oz (100.5 kg)   Body mass index is 29.24 kg/m.  Advanced Directives 01/13/2021 05/29/2017 02/13/2017  Does Patient Have a Medical Advance Directive? No No No  Would patient like information on creating a medical advance directive? Yes (MAU/Ambulatory/Procedural Areas - Information given) No - Patient declined -    Current Medications (verified) Outpatient Encounter Medications as of 01/13/2021  Medication Sig  . amLODipine (NORVASC) 2.5 MG tablet TAKE 1 TABLET(2.5 MG) BY MOUTH DAILY  . hydrochlorothiazide (HYDRODIURIL) 25 MG tablet Take 1 tablet (25 mg total) by mouth daily.  . rosuvastatin (CRESTOR) 20 MG tablet TAKE 1 TABLET(20 MG) BY MOUTH DAILY   No facility-administered encounter medications on file as of 01/13/2021.    Allergies (verified) Patient has no known allergies.   History: Past Medical History:  Diagnosis Date  . Elevated PSA    with prostatitis  . Gilbert's syndrome   . Hemorrhoids, external   . Hyperlipidemia   . Spermatic cord cyst    Past Surgical History:  Procedure Laterality Date  . CATARACT EXTRACTION W/PHACO Left 06/03/2017   Procedure: CATARACT EXTRACTION PHACO AND INTRAOCULAR LENS PLACEMENT (IOC);  Surgeon: HTonny Branch MD;  Location: [facility];  Service: Ophthalmology;  Laterality: Left;  CDE: 15.27  . CATARACT EXTRACTION W/PHACO Right 09/02/2017   Procedure: CATARACT EXTRACTION PHACO AND INTRAOCULAR LENS PLACEMENT RIGHT EYE;  Surgeon: HTonny Branch MD;  Location: [facility];  Service: Ophthalmology;  Laterality: Right;  CDE: 7.92  . HAND SURGERY  2005   "lumps on  head"   Family History  Problem Relation Age of Onset  . Cancer Father        spinal 79 . Cancer Son        testicular cancer  . Cancer Paternal Grandfather        spinal 543 . Prostate cancer Maternal Uncle        did not die of   Social History   Socioeconomic History  . Marital status: Married    Spouse name: Not on file  . Number of children: Not on file  . Years of education: Not on file  . Highest education level: Not on file  Occupational History  . Occupation: eProgrammer, systems [employer]  Tobacco Use  . Smoking status: Never Smoker  . Smokeless tobacco: Never Used  Vaping Use  . Vaping Use: Never used  Substance and Sexual Activity  . Alcohol use: No  . Drug use: No  . Sexual activity: Yes    Birth control/protection: None  Other Topics Concern  . Not on file  Social History Narrative   Married 1966. 2 sons ERandall Hiss not married and no kids, and JLarkin Inawith 1 daughter- 163in 2021, not married.      Retired from work eClinical biochemistat [employer] Very active working at home.       Hobbies: yardwork, working at [employer]    Social Determinants of Health   Financial Resource Strain: Low Risk   . Difficulty of Paying Living Expenses:  Not hard at all  Food Insecurity: No Food Insecurity  . Worried About Charity fundraiser in the Last Year: Never true  . Ran Out of Food in the Last Year: Never true  Transportation Needs: No Transportation Needs  . Lack of Transportation (Medical): No  . Lack of Transportation (Non-Medical): No  Physical Activity: Inactive  . Days of Exercise per Week: 0 days  . Minutes of Exercise per Session: 0 min  Stress: No Stress Concern Present  . Feeling of Stress : Not at all  Social Connections: Moderately Isolated  . Frequency of Communication with Friends and Family: More than three times a week  . Frequency of Social Gatherings with Friends and Family: Twice a week  . Attends Religious Services: Never   . Active Member of Clubs or Organizations: No  . Attends Archivist Meetings: Never  . Marital Status: Married    Tobacco Counseling Counseling given: Not Answered   Clinical Intake:  Pre-visit preparation completed: Yes  Pain : No/denies pain     BMI - recorded: 29.24 Nutritional Status: BMI 25 -29 Overweight Nutritional Risks: None Diabetes: No  How often do you need to have someone help you when you read instructions, pamphlets, or other written materials from your doctor or pharmacy?: 1 - Never  Diabetic?No  Interpreter Needed?: No  Information entered by :: Charlott Rakes, LPN   Activities of Daily Living In your present state of health, do you have any difficulty performing the following activities: 01/13/2021 05/19/2020  Hearing? N N  Vision? N N  Difficulty concentrating or making decisions? N N  Walking or climbing stairs? N N  Dressing or bathing? N N  Doing errands, shopping? N N  Preparing Food and eating ? N -  Using the Toilet? N -  In the past six months, have you accidently leaked urine? N -  Do you have problems with loss of bowel control? N -  Managing your Medications? N -  Managing your Finances? N -  Housekeeping or managing your Housekeeping? N -  Some recent data might be hidden    Patient Care Team: Marin Olp, MD as PCP - General (Family Medicine)  Indicate any recent Medical Services you may have received from other than [facility] providers in the past year (date may be approximate).     Assessment:   This is a routine wellness examination for Henry Kirk.  Hearing/Vision screen  Hearing Screening   125Hz 250Hz 500Hz 1000Hz 2000Hz 3000Hz 4000Hz 6000Hz 8000Hz  Right ear:           Left ear:           Comments: Pt denies hearing issues   Vision Screening Comments: Pt has not followed up since cataracts and covid   Dietary issues and exercise activities discussed: Current Exercise Habits: The patient does not  participate in regular exercise at present  Goals    . Exercise 150 minutes per week (moderate activity)     Check out the silver sneaker program Explore what clubs or activities are available to you in [place]  Walking will be beneficial  Older adults aged 6 or older, who are generally fit and have no health conditions that limit their mobility, should try to be active daily and should do: at least 150 minutes of moderate aerobic activity such as cycling or walking every week, and  strength exercises on two or more days a week that work all the major muscles (  legs, hips, back, abdomen, chest, shoulders and arms).      OR  75 minutes of vigorous aerobic activity such as running or a game of singles tennis every week, and  strength exercises on two or more days a week that work all the major muscles (legs, hips, back, abdomen, chest, shoulders and arms).     OR  a mix of moderate and vigorous aerobic activity every week. For example, two 30-minute runs, plus 30 minutes of fast walking, equates to 150 minutes of moderate aerobic activity, and  strength exercises on two or more days a week that work all the major muscles (legs, hips, back, abdomen, chest, shoulders and arms).  A rule of thumb is that one minute of vigorous activity provides the same health benefits as two minutes of moderate activity. You should also try to break up long periods of sitting with light activity, as sedentary behaviour is now considered an independent risk factor for ill health, no matter how much exercise you do. Find out why sitting is bad for your health. Older adults at risk of falls, such as people with weak legs, poor balance and some medical conditions, should do exercises to improve balance and co-ordination on at least two days a week. Examples include yoga, tai chi and dancing.      . Patient Stated     Stay healthy without going downhill      Depression Screen PHQ 2/9 Scores 01/13/2021  05/19/2020 04/13/2019 03/19/2018 03/19/2018 02/13/2017 07/12/2015  PHQ - 2 Score 0 0 0 2 0 0 0  PHQ- 9 Score - 0 0 2 - - -    Fall Risk Fall Risk  01/13/2021 05/19/2020 05/19/2020 03/19/2018 02/13/2017  Falls in the past year? 0 0 0 No No  Number falls in past yr: 0 0 0 - -  Injury with Fall? 0 0 0 - -  Risk for fall due to : Impaired vision;Impaired balance/gait - - - -  Follow up Falls prevention discussed - - - -    FALL RISK PREVENTION PERTAINING TO THE HOME:  Any stairs in or around the home? Yes  If so, are there any without handrails? No  Home free of loose throw rugs in walkways, pet beds, electrical cords, etc? Yes  Adequate lighting in your home to reduce risk of falls? Yes   ASSISTIVE DEVICES UTILIZED TO PREVENT FALLS:  Life alert? No  Use of a cane, walker or w/c? No  Grab bars in the bathroom? No  Shower chair or bench in shower? No  Elevated toilet seat or a handicapped toilet? No   TIMED UP AND GO:  Was the test performed? Yes .  Length of time to ambulate 10 feet: 10 sec.   Gait steady and fast without use of assistive device  Cognitive Function: MMSE - Mini Mental State Exam 02/13/2017  Not completed: (No Data)     6CIT Screen 01/13/2021  What Year? 0 points  What month? 0 points  Count back from 20 0 points  Months in reverse 0 points  Repeat phrase 0 points    Immunizations Immunization History  Administered Date(s) Administered  . Moderna Sars-Covid-2 Vaccination 01/19/2020, 02/16/2020  . Pneumococcal Conjugate-13 03/19/2018  . Pneumococcal Polysaccharide-23 01/10/2012  . Tdap 07/01/2014  . Zoster 05/02/2009    TDAP status: Up to date  Flu Vaccine status: Due, Education has been provided regarding the importance of this vaccine. Advised may receive this vaccine at local pharmacy or  Health Dept. Aware to provide a copy of the vaccination record if obtained from local pharmacy or Health Dept. Verbalized acceptance and understanding.  Pneumococcal vaccine  status: Up to date  Covid-19 vaccine status: Completed vaccines  Qualifies for Shingles Vaccine? Yes   Zostavax completed Yes   Shingrix Completed?: No.    Education has been provided regarding the importance of this vaccine. Patient has been advised to call insurance company to determine out of pocket expense if they have not yet received this vaccine. Advised may also receive vaccine at local pharmacy or Health Dept. Verbalized acceptance and understanding.  Screening Tests Health Maintenance  Topic Date Due  . COVID-19 Vaccine (3 - Moderna risk 4-dose series) 03/15/2020  . Hepatitis C Screening  10/11/2098 (Originally [birth year])  . INFLUENZA VACCINE  05/08/2021  . Fecal DNA (Cologuard)  04/27/2023  . TETANUS/TDAP  07/01/2024  . PNA vac Low Risk Adult  Completed  . HPV VACCINES  Aged Out    Health Maintenance  Health Maintenance Due  Topic Date Due  . COVID-19 Vaccine (3 - Moderna risk 4-dose series) 03/15/2020    Colorectal cancer screening: Type of screening: Cologuard. Completed 04/26/20. Repeat every 3 years  Additional Screening:  Hepatitis C Screening: does qualify;   Vision Screening: Recommended annual ophthalmology exams for early detection of glaucoma and other disorders of the eye. Is the patient up to date with their annual eye exam?  No  Who is the provider or what is the name of the office in which the patient attends annual eye exams? No one at this time If pt is not established with a provider, would they like to be referred to a provider to establish care? No .   Dental Screening: Recommended annual dental exams for proper oral hygiene  Community Resource Referral / Chronic Care Management: CRR required this visit?  No   CCM required this visit?  No      Plan:     I have personally reviewed and noted the following in the patient's chart:   . Medical and social history . Use of alcohol, tobacco or illicit drugs  . Current medications and  supplements . Functional ability and status . Nutritional status . Physical activity . Advanced directives . List of other physicians . Hospitalizations, surgeries, and ER visits in previous 12 months . Vitals . Screenings to include cognitive, depression, and falls . Referrals and appointments  In addition, I have reviewed and discussed with patient certain preventive protocols, quality metrics, and best practice recommendations. A written personalized care plan for preventive services as well as general preventive health recommendations were provided to patient.     Willette Brace, LPN   05/08/8562   Nurse Notes: Informed of med changes per pt request. Related to being dizzy with HCTZ at 25 mg

## 2021-01-16 ENCOUNTER — Telehealth: Payer: Self-pay

## 2021-01-16 NOTE — Telephone Encounter (Signed)
Pt is requesting an appt May 5 at 9:20 to follow up on some things discussed in his AWV. Please advise if I can use a SAMEDAY     Covid Moderna covid booster shot- Sep 08, 2020

## 2021-01-16 NOTE — Telephone Encounter (Signed)
Pt is scheduled °

## 2021-01-16 NOTE — Telephone Encounter (Signed)
Okay to use same day, I also updated his booster date.

## 2021-02-08 NOTE — Progress Notes (Deleted)
duplicate

## 2021-02-08 NOTE — Patient Instructions (Addendum)
We discussed potentially updating chest x-ray with ongoing cough for 4 years-he wanted to hold off for now.  I would at least suggest trying Claritin before bedtime since this seems to be worse in the spring.   Glad blood pressure is so much better-continue current medication. Goal blood pressure at home <135/85- would encourage checking at least once a week to monitor  Please stop by lab before you go just ot check on kidney function If you have mychart- we will send your results within 3 business days of [place] receiving them.  If you do not have mychart- we will call you about results within 5 business days of [place] receiving them.  *please also note that you will see labs on mychart as soon as they post. I will later go in and write notes on them- will say "notes from Dr. Yong Channel"    Recommended follow up: Return in about 6 months (around 08/12/2021) for physical or sooner if needed. - update bloodwork at that time

## 2021-02-09 ENCOUNTER — Ambulatory Visit (INDEPENDENT_AMBULATORY_CARE_PROVIDER_SITE_OTHER): Payer: Medicare Other | Admitting: Family Medicine

## 2021-02-09 ENCOUNTER — Other Ambulatory Visit: Payer: Self-pay

## 2021-02-09 ENCOUNTER — Encounter: Payer: Self-pay | Admitting: Family Medicine

## 2021-02-09 VITALS — BP 134/86 | HR 60 | Temp 98.1°F | Ht 73.0 in | Wt 221.0 lb

## 2021-02-09 DIAGNOSIS — E785 Hyperlipidemia, unspecified: Secondary | ICD-10-CM | POA: Diagnosis not present

## 2021-02-09 DIAGNOSIS — I10 Essential (primary) hypertension: Secondary | ICD-10-CM

## 2021-02-09 DIAGNOSIS — I700 Atherosclerosis of aorta: Secondary | ICD-10-CM

## 2021-02-09 DIAGNOSIS — R053 Chronic cough: Secondary | ICD-10-CM

## 2021-02-09 LAB — BASIC METABOLIC PANEL
BUN: 14 mg/dL (ref 6–23)
CO2: 30 mEq/L (ref 19–32)
Calcium: 9.4 mg/dL (ref 8.4–10.5)
Chloride: 103 mEq/L (ref 96–112)
Creatinine, Ser: 1.09 mg/dL (ref 0.40–1.50)
GFR: 66.35 mL/min (ref >60.00)
Glucose, Bld: 99 mg/dL (ref 70–99)
Potassium: 4 mEq/L (ref 3.5–5.1)
Sodium: 140 mEq/L (ref 135–145)

## 2021-02-09 NOTE — Progress Notes (Signed)
Phone [phone] In person visit   Subjective:   Henry Kirk is a 75 y.o. year old very pleasant male patient who presents for/with See problem oriented charting Chief Complaint  Patient presents with  . Hypertension    Concerns at [facility]   This visit occurred during the SARS-CoV-2 public health emergency.  Safety protocols were in place, including screening questions prior to the visit, additional usage of staff PPE, and extensive cleaning of exam room while observing appropriate contact time as indicated for disinfecting solutions.   Past Medical History-  Patient Active Problem List   Diagnosis Date Noted  . Aortic atherosclerosis ([place]) 05/19/2020    Priority: Medium  . Bradycardia 04/16/2018    Priority: Medium  . Hypertension, essential 03/19/2018    Priority: Medium  . Essential tremor 07/01/2014    Priority: Medium  . 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 Hyperlipidemia 04/25/2009    Priority: Medium  . Prostate cancer screening 07/01/2014    Priority: Low  . Other malaise and fatigue 04/26/2009    Priority: Low  . Cough 03/19/2018    Medications- reviewed and updated Current Outpatient Medications  Medication Sig Dispense Refill  . amLODipine (NORVASC) 2.5 MG tablet TAKE 1 TABLET(2.5 MG) BY MOUTH DAILY 90 tablet 3  . hydrochlorothiazide (HYDRODIURIL) 12.5 MG tablet Take 1 tablet (12.5 mg total) by mouth daily. 90 tablet 3  . rosuvastatin (CRESTOR) 20 MG tablet TAKE 1 TABLET(20 MG) BY MOUTH DAILY 90 tablet 3   No current facility-administered medications for this visit.     Objective:  BP 134/86   Pulse 60   Temp 98.1 F (36.7 C) (Temporal)   Ht 6\' 1"  (1.854 m)   Wt 221 lb (100.2 kg)   SpO2 94%   BMI 29.16 kg/m  Gen: NAD, resting comfortably CV: RRR no murmurs rubs or gallops Lungs: CTAB no crackles, wheeze, rhonchi Abdomen: soft/nontender/nondistended/normal bowel sounds. No rebound or guarding.  Ext: trace edema bilaterally   Skin: warm, dry Neuro: grossly normal, moves all extremities     Assessment and Plan   #hypertension S: medication: Amlodipine 2.5 mg, hydrochlorothiazide 12.5 mg.  We recently reduced patient's medication from 25mg  to 12.5 mg due to some orthostatic symptoms at our annual wellness visit. He had some lightheadedness but after cutting his pill in half his symptoms have resolved. Home readings #s: over the past 5 days have range from 114-133/65-80.  BP Readings from Last 3 Encounters:  02/09/21 134/86  01/13/21 (!) 145/75  05/19/20 (!) 152/92  A/P: Well-controlled-continue current medication. Thankfully improved-continue to monitor at home with goal blood pressure less than 135/85  #hyperlipidemia/aortic atherosclerosis S: Medication: Rosuvastatin 20 mg. No side effects.  Lab Results  Component Value Date   CHOL 130 05/19/2020   HDL 47 05/19/2020   LDLCALC 67 05/19/2020   LDLDIRECT 177.5 01/14/2013   TRIG 80 05/19/2020   CHOLHDL 2.8 05/19/2020   A/P: Cholesterol within goal last visit-we will update again at physical in 6 months   Aortic atherosclerosis-continue risk factor modification-no additional treatment needed  # Chronic cough S: Ongoing for 4 years. He has a productive cough in the morning and throughout the night Phlegm appears yellow and green (occurs at times). Symptoms are stable.Marland Kitchen He was recommended to take allergy medications in the past but he denies taking any. Denies any exertional shortness of breath, chest pain, tightness, or pressure.   Chest x-ray largely reassuring in 2018 A/P: With ongoing chronic cough  discussed updating chest x-ray-he declined today.  I did recommend he try Claritin in the evening since symptoms seem to be worse in the spring.  He is considering this.  We will follow-up if symptoms worsen or fail to improve  Recommended follow up: Return in about 6 months (around 08/12/2021) for physical or sooner if needed. Future Appointments  Date Time  Provider [place]  01/19/2022  9:30 AM [facility] CO[facility]    Lab/Order associations:   ICD-10-CM   1. Hypertension, essential  V89 Basic metabolic panel  2. Aortic atherosclerosis (HCC) Chronic I70.0   3. Hyperlipidemia, unspecified hyperlipidemia type  E78.5   4. Chronic cough  R05.3    I,Alexis Bryant,acting as a scribe for Garret Reddish, MD.,have documented all relevant documentation on the behalf of Garret Reddish, MD,as directed by  Garret Reddish, MD while in the presence of Garret Reddish, MD.  I, Garret Reddish, MD, have reviewed all documentation for this visit. The documentation on 02/09/21 for the exam, diagnosis, procedures, and orders are all accurate and complete.  Return precautions advised.  Garret Reddish, MD

## 2021-05-18 ENCOUNTER — Other Ambulatory Visit: Payer: Self-pay | Admitting: Family Medicine

## 2021-05-19 NOTE — Progress Notes (Signed)
Phone [phone] In person visit   Subjective:   Henry Kirk is a 75 y.o. year old very pleasant male patient who presents for/with See problem oriented charting Chief Complaint  Patient presents with   Back Pain    3-4 weeks, Patient states that it's technically not a pain it's "something there he notices, feels like somebody is pushing there finger in that one spot"     This visit occurred during the SARS-CoV-2 public health emergency.  Safety protocols were in place, including screening questions prior to the visit, additional usage of staff PPE, and extensive cleaning of exam room while observing appropriate contact time as indicated for disinfecting solutions.   Past Medical History-  Patient Active Problem List   Diagnosis Date Noted   Aortic atherosclerosis ([place]) 05/19/2020    Priority: Medium   Bradycardia 04/16/2018    Priority: Medium   Hypertension, essential 03/19/2018    Priority: Medium   Essential tremor 07/01/2014    Priority: Medium   Gilbert's syndrome 01/16/2013    Priority: Medium   Hyperlipidemia 04/25/2009    Priority: Medium   Prostate cancer screening 07/01/2014    Priority: Low   Other malaise and fatigue 04/26/2009    Priority: Low   Cough 03/19/2018    Medications- reviewed and updated Current Outpatient Medications  Medication Sig Dispense Refill   amLODipine (NORVASC) 2.5 MG tablet TAKE 1 TABLET(2.5 MG) BY MOUTH DAILY 90 tablet 3   hydrochlorothiazide (HYDRODIURIL) 12.5 MG tablet Take 1 tablet (12.5 mg total) by mouth daily. 90 tablet 3   rosuvastatin (CRESTOR) 20 MG tablet TAKE 1 TABLET(20 MG) BY MOUTH DAILY 90 tablet 3   No current facility-administered medications for this visit.     Objective:  BP 134/78   Pulse (!) 58   Temp 98.2 F (36.8 C) (Temporal)   Ht '6\' 1"'$  (1.854 m)   Wt 219 lb 6.4 oz (99.5 kg)   SpO2 97%   BMI 28.95 kg/m  Gen: NAD, resting comfortably CV: RRR no murmurs rubs or gallops Lungs: CTAB no  crackles, wheeze, rhonchi Abdomen: soft/nontender/nondistended/normal bowel sounds. No rebound or guarding.  Ext: no edema Skin: warm, dry Neuro: grossly normal, moves all extremities Back - Normal skin, Spine with normal alignment and no deformity.  No tenderness to vertebral process palpation other than in lower half of thoracic spine 2 pinpoint areas over vertebrae that are tender.  Paraspinous muscles are not tender and without spasm.   Range of motion is full at neck and lumbar sacral regions. Negative Straight leg raise.  Neuro- no saddle anesthesia, 5/5 strength lower extremities, 2+ reflexes     Assessment and Plan    # thoracic Back Pain S:  patient stated about a month ago -Sleeps on his stomach and has his arm underneath him- also around same time started in the center of mid back. 1.5 weeks went by and called in for appointment - planned to cancel if pain went away but has persisted. If he stretches- more of a mild discomfort than pain- almost more of a feeling. Grandfather died with cancer  at 87 in his back and father  at 21 died with cancer in his back- both were in thoracic spine  Worse with (sitting-related to herniated disc compression)- worse with stretching overhead Relieved by-distraction by other various aches/pains  Previous Treatment-no medicine Previous imaging-none  ROS-No saddle anesthesia, bladder incontinence, fecal incontinence, weakness in extremity, numbness or tingling in extremity. History negative for trauma,  history of cancer, fever, chills, unintentional weight loss, recent bacterial infection, recent IV drug use, HIV, pain worse at night or while supine.  A/P: 75 year old male with thoracic midline back pain with family history of spinal cancer of thoracic spine in both father and grandfather-we are going to get an x-ray as a starting point.  I did ask him even if x-ray is normal to update me in the next 2 or 3 weeks with how he is doing-if no improvement  would refer to sports medicine to consider further work-up or more advanced imaging type  .#hypertension S: medication: Amlodipine 2.5 mg daily, hydrochlorothiazide 12.5 mg daily. No more orthostatic symptoms BP Readings from Last 3 Encounters:  05/22/21 134/78  02/09/21 134/86  01/13/21 (!) 145/75  A/P: Stable. Continue current medications.   # Chronic cough S: Been ongoing for 4 years. He had a productive cough in the morning and throughout the night Phlegm appeared yellow and green (occurred at times). Symptoms were stable.Marland Kitchen He was recommended to take allergy medications in the past but he denied taking any. Denied any exertional shortness of breath, chest pain, tightness or pressure.    Chest x-ray largely reassured in 2018 A/P: no significant worsening- will continue to monitor. Declined CXR last visit- worsens seasonally. Prior workup/efforts was not helpful   Recommended follow up: No follow-ups on file. Future Appointments  Date Time Provider [place]  08/16/2021  9:20 AM Marin Olp, MD [facility]  01/19/2022  9:30 AM [facility] CO[facility]    Lab/Order associations:   ICD-10-CM   1. Acute midline thoracic back pain  M54.6 DG Thoracic Spine W/Swimmers    2. Hypertension, essential  I10      I,Jada Bradford,acting as a scribe for Garret Reddish, MD.,have documented all relevant documentation on the behalf of Garret Reddish, MD,as directed by  Garret Reddish, MD while in the presence of Garret Reddish, MD.  I, Garret Reddish, MD, have reviewed all documentation for this visit. The documentation on 05/22/21 for the exam, diagnosis, procedures, and orders are all accurate and complete.  Return precautions advised.  Garret Reddish, MD

## 2021-05-22 ENCOUNTER — Other Ambulatory Visit: Payer: Self-pay

## 2021-05-22 ENCOUNTER — Encounter: Payer: Self-pay | Admitting: Family Medicine

## 2021-05-22 ENCOUNTER — Ambulatory Visit (INDEPENDENT_AMBULATORY_CARE_PROVIDER_SITE_OTHER)
Admission: RE | Admit: 2021-05-22 | Discharge: 2021-05-22 | Disposition: A | Discharge status: Home / Self Care | Payer: Medicare Other | Source: Ambulatory Visit | Attending: Family Medicine | Admitting: Family Medicine

## 2021-05-22 ENCOUNTER — Ambulatory Visit (INDEPENDENT_AMBULATORY_CARE_PROVIDER_SITE_OTHER): Payer: Medicare Other | Admitting: Family Medicine

## 2021-05-22 VITALS — BP 134/78 | HR 58 | Temp 98.2°F | Ht 73.0 in | Wt 219.4 lb

## 2021-05-22 DIAGNOSIS — M546 Pain in thoracic spine: Secondary | ICD-10-CM | POA: Diagnosis not present

## 2021-05-22 DIAGNOSIS — M2578 Osteophyte, vertebrae: Secondary | ICD-10-CM | POA: Diagnosis not present

## 2021-05-22 DIAGNOSIS — I700 Atherosclerosis of aorta: Secondary | ICD-10-CM

## 2021-05-22 DIAGNOSIS — I10 Essential (primary) hypertension: Secondary | ICD-10-CM

## 2021-05-22 DIAGNOSIS — M419 Scoliosis, unspecified: Secondary | ICD-10-CM | POA: Diagnosis not present

## 2021-05-22 DIAGNOSIS — E785 Hyperlipidemia, unspecified: Secondary | ICD-10-CM

## 2021-05-22 DIAGNOSIS — M47814 Spondylosis without myelopathy or radiculopathy, thoracic region: Secondary | ICD-10-CM | POA: Diagnosis not present

## 2021-05-22 NOTE — Patient Instructions (Addendum)
Health Maintenance Due  Topic Date Due   Zoster Vaccines- Shingrix (1 of 2)   - Please check with your pharmacy to see if they have the shingrix vaccine. If they do- please get this immunization and update [place] by phone call or mychart with dates you receive the vaccine  Never done   COVID-19 Vaccine (4 - Booster for Moderna series)   -Will get this scheduled in the fall when new variant specific strain comes out 12/07/2020   INFLUENZA VACCINE Not available in office.   - Please consider getting your flu shot in the Fall. If you get this outside of our office, please let [place] know.  05/08/2021   Please go to [place]  central X-ray (updated 12/03/2019) - located [address] across the street from [place] - in the basement - Hours: 8:30-5:00 PM M-F (with lunch from 12:30- 1 PM). You do NOT need an appointment.  - Please ensure you are covid symptom free before going in(No fever, chills, cough, congestion, runny nose, shortness of breath, fatigue, body aches, sore throat, headache, nausea, vomiting, diarrhea, or new loss of taste or smell. No known contacts with covid 19 or someone being tested for covid 19)  IF symptoms do not resolve in 2-3 weeks even if x-ray is ok- I want you to let me knwo so I can refer to sports medicine for their opinion   Recommended follow up: Return for as needed for new, worsening, persistent symptoms.

## 2021-06-19 ENCOUNTER — Other Ambulatory Visit: Payer: Self-pay | Admitting: Family Medicine

## 2021-06-30 ENCOUNTER — Other Ambulatory Visit: Payer: Self-pay | Admitting: Family Medicine

## 2021-07-03 ENCOUNTER — Other Ambulatory Visit: Payer: Self-pay | Admitting: Family Medicine

## 2021-08-08 NOTE — Progress Notes (Incomplete)
Phone: [phone]   Subjective:  Patient presents today for their annual physical. Chief complaint-noted.   See problem oriented charting- ROS- full  review of systems was completed and negative  except for: ***  The following were reviewed and entered/updated in epic: Past Medical History:  Diagnosis Date   Elevated PSA    with prostatitis   Gilbert's syndrome    Hemorrhoids, external    Hyperlipidemia    Spermatic cord cyst    Patient Active Problem List   Diagnosis Date Noted   Aortic atherosclerosis ([place]) 05/19/2020   Bradycardia 04/16/2018   Hypertension, essential 03/19/2018   Cough 03/19/2018   Essential tremor 07/01/2014   Prostate cancer screening 07/01/2014   Gilbert's syndrome 01/16/2013   Other malaise and fatigue 04/26/2009   Hyperlipidemia 04/25/2009   Past Surgical History:  Procedure Laterality Date   CATARACT EXTRACTION W/PHACO Left 06/03/2017   Procedure: CATARACT EXTRACTION PHACO AND INTRAOCULAR LENS PLACEMENT ([place]);  Surgeon: Tonny Branch, MD;  Location: [facility];  Service: Ophthalmology;  Laterality: Left;  CDE: 15.27   CATARACT EXTRACTION W/PHACO Right 09/02/2017   Procedure: CATARACT EXTRACTION PHACO AND INTRAOCULAR LENS PLACEMENT RIGHT EYE;  Surgeon: Tonny Branch, MD;  Location: [facility];  Service: Ophthalmology;  Laterality: Right;  CDE: 7.92   HAND SURGERY  2005   "lumps on head"    Family History  Problem Relation Age of Onset   Cancer Father        spinal 4   Cancer Son        testicular cancer   Cancer Paternal Grandfather        spinal 20   Prostate cancer Maternal Uncle        did not die of    Medications- reviewed and updated Current Outpatient Medications  Medication Sig Dispense Refill   amLODipine (NORVASC) 2.5 MG tablet TAKE 1 TABLET(2.5 MG) BY MOUTH DAILY 90 tablet 3   hydrochlorothiazide (HYDRODIURIL) 12.5 MG tablet Take 1 tablet (12.5 mg total) by mouth daily. 90 tablet 3   rosuvastatin (CRESTOR) 20 MG tablet TAKE 1  TABLET(20 MG) BY MOUTH DAILY 90 tablet 3   No current facility-administered medications for this visit.    Allergies-reviewed and updated No Known Allergies  Social History   Social History Narrative   Married 1966. 2 sons Randall Hiss, not married and no kids, and Larkin Ina with 1 daughter- 62 in 2021, not married.      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       Hobbies: yardwork, working at [employer].    Objective  Objective:  There were no vitals taken for this visit. Gen: NAD, resting comfortably HEENT: Mucous membranes are moist. Oropharynx normal Neck: no thyromegaly CV: RRR no murmurs rubs or gallops Lungs: CTAB no crackles, wheeze, rhonchi Abdomen: soft/nontender/nondistended/normal bowel sounds. No rebound or guarding.  Ext: no edema Skin: warm, dry Neuro: grossly normal, moves all extremities, PERRLA ***   Assessment and Plan  75 y.o. male presenting for annual physical.  Health Maintenance counseling: 1. Anticipatory guidance: Patient counseled regarding regular dental exams ***q6 months-  recent root canal, ***, eye exams -yearly advised- he has done well since cataracts and just going prn- no recent issues ***,  avoiding smoking and second hand smoke*** , limiting alcohol to 2 beverages per day -rarely drinks  ***.  No illicit drugs - *** 2. Risk factor reduction:  Advised patient of need for regular exercise and diet rich  and fruits and vegetables to reduce risk of heart attack and stroke.  Exercise- does not exercise- states having trouble to get into groove . Trying to be active around the home at least. *** Diet-down 3 lbs from last year- .tries to eat healthy diet.  ***.  Wt Readings from Last 3 Encounters:  05/22/21 219 lb 6.4 oz (99.5 kg)  02/09/21 221 lb (100.2 kg)  01/13/21 221 lb 9.6 oz (100.5 kg)   3. Immunizations/screenings/ancillary studies DISCUSSED:  -Flu vaccination- *** -COVID booster vaccination #4 -  *** -Shingrix vaccination #1 - *** Immunization History  Administered Date(s) Administered   Moderna Sars-Covid-2 Vaccination 01/19/2020, 02/16/2020, 09/08/2020   Pneumococcal Conjugate-13 03/19/2018   Pneumococcal Polysaccharide-23 01/10/2012   Tdap 07/01/2014   Zoster, Live 05/02/2009   Health Maintenance Due  Topic Date Due   Zoster Vaccines- Shingrix (1 of 2) Never done   COVID-19 Vaccine (4 - Booster for Moderna series) 11/03/2020   INFLUENZA VACCINE  Never done   4. Prostate cancer screening- nocturia more recently and with high PSA wants to do another level . From 2016 to 2019 had decent increase so we have opted to monitor plus with more nocturia want to make sure not trending up more.  --declines rectal- could do at 1-2 month follow up if needed***  Lab Results  Component Value Date   PSA 2.2 05/19/2020   PSA 3.40 04/13/2019   PSA 3.65 03/19/2018   5. Colon cancer screening - cologuard 2018 due-  just completed home test will call with contact information so that I can get results. He wants to wait until next year on cologuard or possibly stop screening - sounds like fobt or fit testing *** 6. Skin cancer screening- ***advised regular sunscreen use. Denies worrisome, changing, or new skin lesions. Followed in past by dermatologyHas not been seen this year would like to have two areas looked at. Aks x2 as below 7. Smoking associated screening (lung cancer screening, AAA screen 65-75, UA)- Never smoker- *** 8. STD screening -  married- declines***  Status of chronic or acute concerns     # thoracic Back Pain S:  patient stated about a month ago -Slept on his stomach and had his arm underneath him- also around same time started in the center of mid back. 1.5 weeks went by around in August and called in for appointment - planned to cancel if pain went away but has persisted. If he stretches- more of a mild discomfort than pain- almost more of a feeling. Grandfather died with  cancer  at 41 in his back and father  at 28 died with cancer in his back- both were in thoracic spine   Worsened with (sitting-related to herniated disc compression)- worsened with stretching overhead Relieved by-distraction by other various aches/pains  Previous Treatment-no medicine Previous imaging-none  - I did ask him even if x-ray was normal to update me in the next 2 or 3 weeks with how he was doing on 8/22 visit-if no improvement would refer to sports medicine to consider further work-up or more advanced imaging type   ROS-No saddle anesthesia, bladder incontinence, fecal incontinence, weakness in extremity, numbness or tingling in extremity. History negative for trauma, history of cancer, fever, chills, unintentional weight loss, recent bacterial infection, recent IV drug use, HIV, pain worse at night or while supine.  A/P: ***  .#hypertension S: medication: Amlodipine 2.5 mg daily, hydrochlorothiazide 12.5 mg daily. No more orthostatic symptoms Home readings #s: *** BP Readings  from Last 3 Encounters:  05/22/21 134/78  02/09/21 134/86  01/13/21 (!) 145/75  A/P: ***   # Chronic cough S: Been ongoing for 4 years. He had a productive cough in the morning and throughout the night Phlegm appeared yellow and green (occurred at times). Symptoms were stable.Marland Kitchen He was recommended to take allergy medications in the past but he denied taking any. Denied any exertional shortness of breath, chest pain, tightness or pressure.    Chest x-ray largely reassured in 2018 -Declined CXR last visit- worsened seasonally. Prior workup/efforts was not helpful  A/P: ***  #hyperlipidemia S: Medication:Crestor 20 mg daily  Lab Results  Component Value Date   CHOL 130 05/19/2020   HDL 47 05/19/2020   LDLCALC 67 05/19/2020   LDLDIRECT 177.5 01/14/2013   TRIG 80 05/19/2020   CHOLHDL 2.8 05/19/2020   A/P: ***  Recommended follow up: No follow-ups on file. Future Appointments  Date Time Provider  [place]  08/16/2021  9:20 AM Marin Olp, MD [facility]  01/19/2022  9:30 AM [facility] CO[facility]    No chief complaint on file.  Lab/Order associations:*** fasting No diagnosis found.  No orders of the defined types were placed in this encounter.  I,Jada Bradford,acting as a scribe for Garret Reddish, MD.,have documented all relevant documentation on the behalf of Garret Reddish, MD,as directed by  Garret Reddish, MD while in the presence of Garret Reddish, MD.  ***  Return precautions advised.  Burnett Corrente

## 2021-08-16 ENCOUNTER — Encounter: Payer: Medicare Other | Admitting: Family Medicine

## 2021-08-16 DIAGNOSIS — I10 Essential (primary) hypertension: Secondary | ICD-10-CM

## 2021-08-16 DIAGNOSIS — M546 Pain in thoracic spine: Secondary | ICD-10-CM

## 2021-08-16 DIAGNOSIS — R053 Chronic cough: Secondary | ICD-10-CM

## 2021-08-16 DIAGNOSIS — E785 Hyperlipidemia, unspecified: Secondary | ICD-10-CM

## 2021-08-16 DIAGNOSIS — Z0000 Encounter for general adult medical examination without abnormal findings: Secondary | ICD-10-CM

## 2021-08-16 DIAGNOSIS — Z23 Encounter for immunization: Secondary | ICD-10-CM

## 2021-11-14 DIAGNOSIS — H25042 Posterior subcapsular polar age-related cataract, left eye: Secondary | ICD-10-CM | POA: Diagnosis not present

## 2022-01-01 ENCOUNTER — Other Ambulatory Visit: Payer: Self-pay | Admitting: Family Medicine

## 2022-01-09 ENCOUNTER — Telehealth: Payer: Self-pay | Admitting: Family Medicine

## 2022-01-09 NOTE — Telephone Encounter (Signed)
Copied from [place] [id number]. Topic: Medicare AWV ?>> Jan 09, 2022 11:10 AM Harris-Coley, Hannah Beat wrote: ?Reason for CRM: LVM 01/09/22 AWV appt time changed from 9:45am to 11am, due to a meeting.  Please confirm appt time change khc ?

## 2022-01-19 ENCOUNTER — Ambulatory Visit (INDEPENDENT_AMBULATORY_CARE_PROVIDER_SITE_OTHER): Payer: Medicare Other

## 2022-01-19 ENCOUNTER — Ambulatory Visit: Payer: Medicare Other

## 2022-01-19 VITALS — BP 100/64 | HR 52 | Temp 98.0°F | Wt 220.2 lb

## 2022-01-19 DIAGNOSIS — Z0000 Encounter for general adult medical examination without abnormal findings: Secondary | ICD-10-CM

## 2022-01-19 NOTE — Patient Instructions (Signed)
Mr. Kirksey , ?Thank you for taking time to come for your Medicare Wellness Visit. I appreciate your ongoing commitment to your health goals. Please review the following plan we discussed and let me know if I can assist you in the future.  ? ?Screening recommendations/referrals: ?Colonoscopy: No longer required  ?Recommended yearly ophthalmology/optometry visit for glaucoma screening and checkup ?Recommended yearly dental visit for hygiene and checkup ? ?Vaccinations: ?Influenza vaccine: 07/27/21 repeat every year  ?Pneumococcal vaccine: Up to date ?Tdap vaccine: Done 07/01/14 repeat every 10 years  ?Shingles vaccine: Shingrix discussed. Please contact your pharmacy for coverage information.    ?Covid-19: Completed 4/13, 5/11, 09/08/20  ? ?Advanced directives: Advance directive discussed with you today. Even though you declined this today please call our office should you change your mind and we can give you the proper paperwork for you to fill out. ? ?Conditions/risks identified: Stay healthy  ? ?Next appointment: Follow up in one year for your annual wellness visit.  ? ?Preventive Care 77 Years and Older, Male ?Preventive care refers to lifestyle choices and visits with your health care provider that can promote health and wellness. ?What does preventive care include? ?A yearly physical exam. This is also called an annual well check. ?Dental exams once or twice a year. ?Routine eye exams. Ask your health care provider how often you should have your eyes checked. ?Personal lifestyle choices, including: ?Daily care of your teeth and gums. ?Regular physical activity. ?Eating a healthy diet. ?Avoiding tobacco and drug use. ?Limiting alcohol use. ?Practicing safe sex. ?Taking low doses of aspirin every day. ?Taking vitamin and mineral supplements as recommended by your health care provider. ?What happens during an annual well check? ?The services and screenings done by your health care provider during your annual well  check will depend on your age, overall health, lifestyle risk factors, and family history of disease. ?Counseling  ?Your health care provider may ask you questions about your: ?Alcohol use. ?Tobacco use. ?Drug use. ?Emotional well-being. ?Home and relationship well-being. ?Sexual activity. ?Eating habits. ?History of falls. ?Memory and ability to understand (cognition). ?Work and work Statistician. ?Screening  ?You may have the following tests or measurements: ?Height, weight, and BMI. ?Blood pressure. ?Lipid and cholesterol levels. These may be checked every 5 years, or more frequently if you are over 63 years old. ?Skin check. ?Lung cancer screening. You may have this screening every year starting at age 38 if you have a 30-pack-year history of smoking and currently smoke or have quit within the past 15 years. ?Fecal occult blood test (FOBT) of the stool. You may have this test every year starting at age 44. ?Flexible sigmoidoscopy or colonoscopy. You may have a sigmoidoscopy every 5 years or a colonoscopy every 10 years starting at age 103. ?Prostate cancer screening. Recommendations will vary depending on your family history and other risks. ?Hepatitis C blood test. ?Hepatitis B blood test. ?Sexually transmitted disease (STD) testing. ?Diabetes screening. This is done by checking your blood sugar (glucose) after you have not eaten for a while (fasting). You may have this done every 1-3 years. ?Abdominal aortic aneurysm (AAA) screening. You may need this if you are a current or former smoker. ?Osteoporosis. You may be screened starting at age 36 if you are at high risk. ?Talk with your health care provider about your test results, treatment options, and if necessary, the need for more tests. ?Vaccines  ?Your health care provider may recommend certain vaccines, such as: ?Influenza vaccine. This is recommended  every year. ?Tetanus, diphtheria, and acellular pertussis (Tdap, Td) vaccine. You may need a Td booster  every 10 years. ?Zoster vaccine. You may need this after age 75. ?Pneumococcal 13-valent conjugate (PCV13) vaccine. One dose is recommended after age 68. ?Pneumococcal polysaccharide (PPSV23) vaccine. One dose is recommended after age 98. ?Talk to your health care provider about which screenings and vaccines you need and how often you need them. ?This information is not intended to replace advice given to you by your health care provider. Make sure you discuss any questions you have with your health care provider. ?Document Released: 10/21/2015 Document Revised: 06/13/2016 Document Reviewed: 07/26/2015 ?Elsevier Interactive Patient Education ? 2017 [place]. ? ?Fall Prevention in the Home ?Falls can cause injuries. They can happen to people of all ages. There are many things you can do to make your home safe and to help prevent falls. ?What can I do on the outside of my home? ?Regularly fix the edges of walkways and driveways and fix any cracks. ?Remove anything that might make you trip as you walk through a door, such as a raised step or threshold. ?Trim any bushes or trees on the path to your home. ?Use bright outdoor lighting. ?Clear any walking paths of anything that might make someone trip, such as rocks or tools. ?Regularly check to see if handrails are loose or broken. Make sure that both sides of any steps have handrails. ?Any raised decks and porches should have guardrails on the edges. ?Have any leaves, snow, or ice cleared regularly. ?Use sand or salt on walking paths during winter. ?Clean up any spills in your garage right away. This includes oil or grease spills. ?What can I do in the bathroom? ?Use night lights. ?Install grab bars by the toilet and in the tub and shower. Do not use towel bars as grab bars. ?Use non-skid mats or decals in the tub or shower. ?If you need to sit down in the shower, use a plastic, non-slip stool. ?Keep the floor dry. Clean up any water that spills on the floor as soon  as it happens. ?Remove soap buildup in the tub or shower regularly. ?Attach bath mats securely with double-sided non-slip rug tape. ?Do not have throw rugs and other things on the floor that can make you trip. ?What can I do in the bedroom? ?Use night lights. ?Make sure that you have a light by your bed that is easy to reach. ?Do not use any sheets or blankets that are too big for your bed. They should not hang down onto the floor. ?Have a firm chair that has side arms. You can use this for support while you get dressed. ?Do not have throw rugs and other things on the floor that can make you trip. ?What can I do in the kitchen? ?Clean up any spills right away. ?Avoid walking on wet floors. ?Keep items that you use a lot in easy-to-reach places. ?If you need to reach something above you, use a strong step stool that has a grab bar. ?Keep electrical cords out of the way. ?Do not use floor polish or wax that makes floors slippery. If you must use wax, use non-skid floor wax. ?Do not have throw rugs and other things on the floor that can make you trip. ?What can I do with my stairs? ?Do not leave any items on the stairs. ?Make sure that there are handrails on both sides of the stairs and use them. Fix handrails that are broken  or loose. Make sure that handrails are as long as the stairways. ?Check any carpeting to make sure that it is firmly attached to the stairs. Fix any carpet that is loose or worn. ?Avoid having throw rugs at the top or bottom of the stairs. If you do have throw rugs, attach them to the floor with carpet tape. ?Make sure that you have a light switch at the top of the stairs and the bottom of the stairs. If you do not have them, ask someone to add them for you. ?What else can I do to help prevent falls? ?Wear shoes that: ?Do not have high heels. ?Have rubber bottoms. ?Are comfortable and fit you well. ?Are closed at the toe. Do not wear sandals. ?If you use a stepladder: ?Make sure that it is fully  opened. Do not climb a closed stepladder. ?Make sure that both sides of the stepladder are locked into place. ?Ask someone to hold it for you, if possible. ?Clearly mark and make sure that you can see: ?Any g

## 2022-01-19 NOTE — Progress Notes (Signed)
? ?Subjective:  ? Joven Mom is a 76 y.o. male who presents for Medicare Annual/Subsequent preventive examination. ? ?Review of Systems    ? ?Cardiac Risk Factors include: advanced age (>67mn, >>54women);hypertension;dyslipidemia;male gender ? ?   ?Objective:  ?  ?Today's Vitals  ? 01/19/22 1053  ?BP: 100/64  ?Pulse: (!) 52  ?Temp: 98 ?F (36.7 ?C)  ?SpO2: 94%  ?Weight: 220 lb 3.2 oz (99.9 kg)  ? ?Body mass index is 29.05 kg/m?. ? ? ?  01/19/2022  ? 11:02 AM 01/13/2021  ?  9:51 AM 05/29/2017  ? 10:21 AM 02/13/2017  ? 10:25 AM  ?Advanced Directives  ?Does Patient Have a Medical Advance Directive? No No No No  ?Would patient like information on creating a medical advance directive? No - Patient declined Yes (MAU/Ambulatory/Procedural Areas - Information given) No - Patient declined   ? ? ?Current Medications (verified) ?Outpatient Encounter Medications as of 01/19/2022  ?Medication Sig  ? amLODipine (NORVASC) 2.5 MG tablet TAKE 1 TABLET(2.5 MG) BY MOUTH DAILY  ? hydrochlorothiazide (HYDRODIURIL) 12.5 MG tablet TAKE 1 TABLET(12.5 MG) BY MOUTH DAILY  ? rosuvastatin (CRESTOR) 20 MG tablet TAKE 1 TABLET(20 MG) BY MOUTH DAILY  ? FLUZONE HIGH-DOSE QUADRIVALENT 0.7 ML SUSY   ? ?No facility-administered encounter medications on file as of 01/19/2022.  ? ? ?Allergies (verified) ?Patient has no known allergies.  ? ?History: ?Past Medical History:  ?Diagnosis Date  ? Elevated PSA   ? with prostatitis  ? Gilbert's syndrome   ? Hemorrhoids, external   ? Hyperlipidemia   ? Spermatic cord cyst   ? ?Past Surgical History:  ?Procedure Laterality Date  ? CATARACT EXTRACTION W/PHACO Left 06/03/2017  ? Procedure: CATARACT EXTRACTION PHACO AND INTRAOCULAR LENS PLACEMENT (IOC);  Surgeon: HTonny Branch MD;  Location: [facility];  Service: Ophthalmology;  Laterality: Left;  CDE: 15.27  ? CATARACT EXTRACTION W/PHACO Right 09/02/2017  ? Procedure: CATARACT EXTRACTION PHACO AND INTRAOCULAR LENS PLACEMENT RIGHT EYE;  Surgeon: HTonny Branch MD;   Location: [facility];  Service: Ophthalmology;  Laterality: Right;  CDE: 7.92  ? HAND SURGERY  2005  ? "lumps on head"  ? ?Family History  ?Problem Relation Age of Onset  ? Cancer Father   ?     spinal 762 ? Cancer Son   ?     testicular cancer  ? Cancer Paternal Grandfather   ?     spinal 58 ? Prostate cancer Maternal Uncle   ?     did not die of  ? ?Social History  ? ?Socioeconomic History  ? Marital status: Married  ?  Spouse name: Not on file  ? Number of children: Not on file  ? Years of education: Not on file  ? Highest education level: Not on file  ?Occupational History  ? Occupation: electrician  ?  Employer: [employer]  ?Tobacco Use  ? Smoking status: Never  ? Smokeless tobacco: Never  ?Vaping Use  ? Vaping Use: Never used  ?Substance and Sexual Activity  ? Alcohol use: No  ? Drug use: No  ? Sexual activity: Yes  ?  Birth control/protection: None  ?Other Topics Concern  ? Not on file  ?Social History Narrative  ? Married 1966. 2 sons ERandall Hiss not married and no kids, and JLarkin Inawith 1 daughter- 197in 2021, not married.  ?   ? Retired from work eClinical biochemistat [employer] Very active working at home.   ?   ?  Hobbies: yardwork, working at [employer].   ? ?Social Determinants of Health  ? ?Financial Resource Strain: Low Risk   ? Difficulty of Paying Living Expenses: Not hard at all  ?Food Insecurity: No Food Insecurity  ? Worried About Charity fundraiser in the Last Year: Never true  ? Ran Out of Food in the Last Year: Never true  ?Transportation Needs: No Transportation Needs  ? Lack of Transportation (Medical): No  ? Lack of Transportation (Non-Medical): No  ?Physical Activity: Inactive  ? Days of Exercise per Week: 0 days  ? Minutes of Exercise per Session: 0 min  ?Stress: No Stress Concern Present  ? Feeling of Stress : Not at all  ?Social Connections: Moderately Isolated  ? Frequency of Communication with Friends and Family: More than three times a week  ? Frequency of Social Gatherings with  Friends and Family: More than three times a week  ? Attends Religious Services: Never  ? Active Member of Clubs or Organizations: No  ? Attends Archivist Meetings: Never  ? Marital Status: Married  ? ? ?Tobacco Counseling ?Counseling given: Not Answered ? ? ?Clinical Intake: ? ?Pre-visit preparation completed: Yes ? ?Pain : No/denies pain ? ?  ? ?BMI - recorded: 29.05 ?Nutritional Status: BMI 25 -29 Overweight ?Nutritional Risks: None ?Diabetes: No ? ?How often do you need to have someone help you when you read instructions, pamphlets, or other written materials from your doctor or pharmacy?: 1 - Never ? ?Diabetic?no ? ?Interpreter Needed?: No ? ?Information entered by :: Charlott Rakes, LPN ? ? ?Activities of Daily Living ? ?  01/19/2022  ? 11:03 AM  ?In your present state of health, do you have any difficulty performing the following activities:  ?Hearing? 0  ?Vision? 0  ?Difficulty concentrating or making decisions? 0  ?Walking or climbing stairs? 0  ?Dressing or bathing? 0  ?Doing errands, shopping? 0  ?Preparing Food and eating ? N  ?Using the Toilet? N  ?In the past six months, have you accidently leaked urine? N  ?Do you have problems with loss of bowel control? N  ?Managing your Medications? N  ?Managing your Finances? N  ?Housekeeping or managing your Housekeeping? N  ? ? ?Patient Care Team: ?Marin Olp, MD as PCP - General (Family Medicine) ? ?Indicate any recent Medical Services you may have received from other than [facility] providers in the past year (date may be approximate). ? ?   ?Assessment:  ? This is a routine wellness examination for Carter. ? ?Hearing/Vision screen ?Hearing Screening - Comments:: Pt denies any hearing issues  ?Vision Screening - Comments:: Pt follows up with [facility] in [place] for eye care ? ?Dietary issues and exercise activities discussed: ?Current Exercise Habits: The patient does not participate in regular exercise at present ? ? Goals Addressed   ? ?  ?  ?   ?  ? This Visit's Progress  ?  Patient Stated     ?  Staying healthy  ?  ? ?  ? ?Depression Screen ? ?  01/19/2022  ? 11:01 AM 05/22/2021  ?  2:27 PM 01/13/2021  ?  9:49 AM 05/19/2020  ?  2:50 PM 04/13/2019  ?  3:55 PM 03/19/2018  ? 10:25 AM 03/19/2018  ?  9:36 AM  ?PHQ 2/9 Scores  ?PHQ - 2 Score 0 0 0 0 0 2 0  ?PHQ- 9 Score    0 0 2   ?  ?Fall  Risk ? ?  01/19/2022  ? 11:03 AM 05/22/2021  ?  2:27 PM 01/13/2021  ?  9:53 AM 05/19/2020  ?  2:50 PM 05/19/2020  ?  2:39 PM  ?Fall Risk   ?Falls in the past year? 0 0 0 0 0  ?Number falls in past yr: 0 0 0 0 0  ?Injury with Fall? 0 0 0 0 0  ?Risk for fall due to : Impaired vision No Fall Risks Impaired vision;Impaired balance/gait    ?Follow up Falls prevention discussed Falls evaluation completed Falls prevention discussed    ? ? ?FALL RISK PREVENTION PERTAINING TO THE HOME: ? ?Any stairs in or around the home? Yes  ?If so, are there any without handrails? No  ?Home free of loose throw rugs in walkways, pet beds, electrical cords, etc? Yes  ?Adequate lighting in your home to reduce risk of falls? Yes  ? ?ASSISTIVE DEVICES UTILIZED TO PREVENT FALLS: ? ?Life alert? No  ?Use of a cane, walker or w/c? No  ?Grab bars in the bathroom? No  ?Shower chair or bench in shower? Yes  ?Elevated toilet seat or a handicapped toilet? No  ? ?TIMED UP AND GO: ? ?Was the test performed? Yes .  ?Length of time to ambulate 10 feet: 10 sec.  ? ?Gait steady and fast without use of assistive device ? ?Cognitive Function: ?  ?  ? ?  01/19/2022  ? 11:04 AM 01/13/2021  ?  9:56 AM  ?6CIT Screen  ?What Year?  0 points  ?What month? 0 points 0 points  ?What time? 0 points   ?Count back from 20 0 points 0 points  ?Months in reverse 0 points 0 points  ?Repeat phrase 2 points 0 points  ? ? ?Immunizations ?Immunization History  ?Administered Date(s) Administered  ? Influenza-Unspecified 07/27/2021  ? Moderna Sars-Covid-2 Vaccination 01/19/2020, 02/16/2020, 09/08/2020  ? Pneumococcal Conjugate-13 03/19/2018  ? Pneumococcal  Polysaccharide-23 01/10/2012  ? Tdap 07/01/2014  ? Zoster, Live 05/02/2009  ? ? ?TDAP status: Up to date ? ?Flu Vaccine status: Up to date ? ?Pneumococcal vaccine status: Up to date ? ?Covid-19 vaccine statu

## 2022-01-22 DIAGNOSIS — H26492 Other secondary cataract, left eye: Secondary | ICD-10-CM | POA: Diagnosis not present

## 2022-01-22 DIAGNOSIS — H40013 Open angle with borderline findings, low risk, bilateral: Secondary | ICD-10-CM | POA: Diagnosis not present

## 2022-01-29 DIAGNOSIS — D225 Melanocytic nevi of trunk: Secondary | ICD-10-CM | POA: Diagnosis not present

## 2022-01-29 DIAGNOSIS — B078 Other viral warts: Secondary | ICD-10-CM | POA: Diagnosis not present

## 2022-01-29 DIAGNOSIS — X32XXXD Exposure to sunlight, subsequent encounter: Secondary | ICD-10-CM | POA: Diagnosis not present

## 2022-01-29 DIAGNOSIS — L820 Inflamed seborrheic keratosis: Secondary | ICD-10-CM | POA: Diagnosis not present

## 2022-01-29 DIAGNOSIS — L570 Actinic keratosis: Secondary | ICD-10-CM | POA: Diagnosis not present

## 2022-01-29 DIAGNOSIS — L821 Other seborrheic keratosis: Secondary | ICD-10-CM | POA: Diagnosis not present

## 2022-03-11 IMAGING — DX DG THORACIC SPINE 3V
3 series · 3 of 3 positions shown · non-contrast
Comparison: Chest x-ray 07/04/2017.

CLINICAL DATA: Thoracic back pain.

EXAM:
THORACIC SPINE - 3 VIEWS

[t-spine ap]
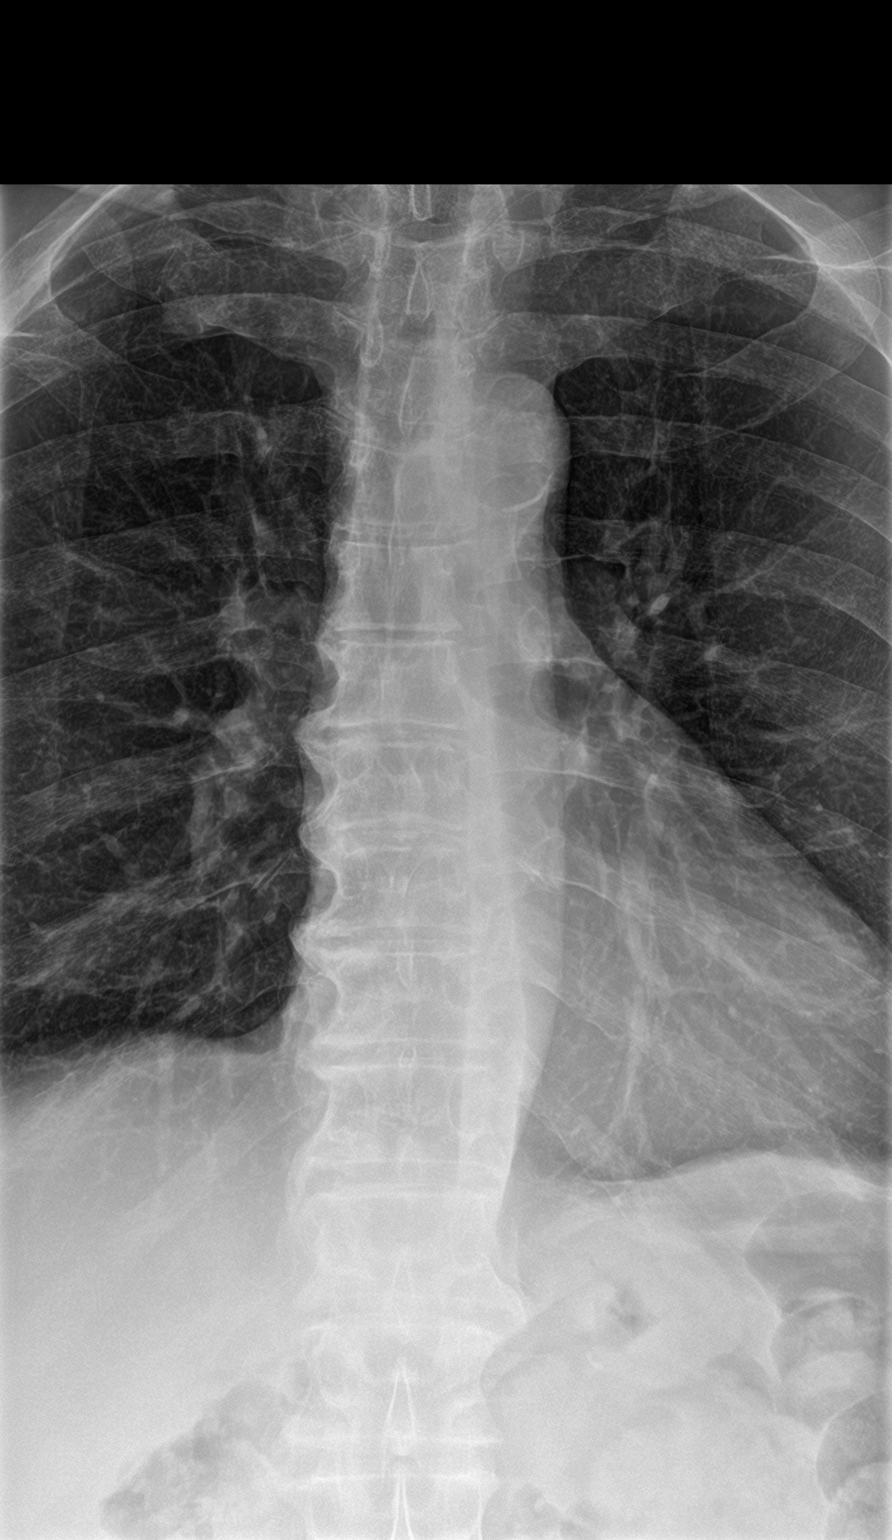

[t-spine lat]
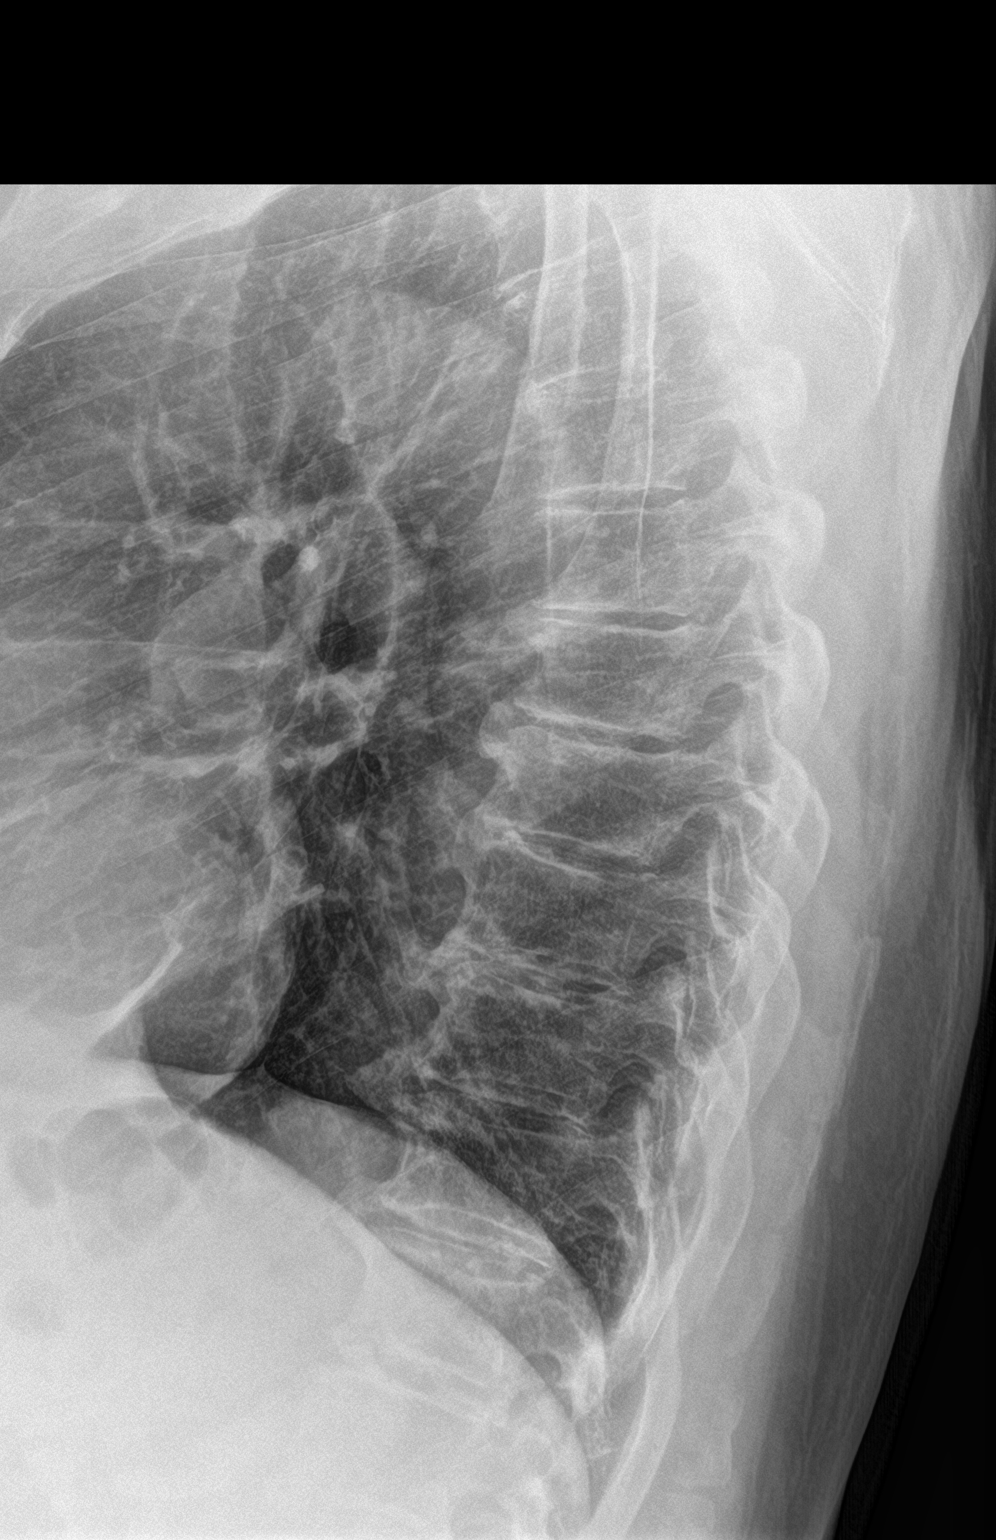

[swimmer]
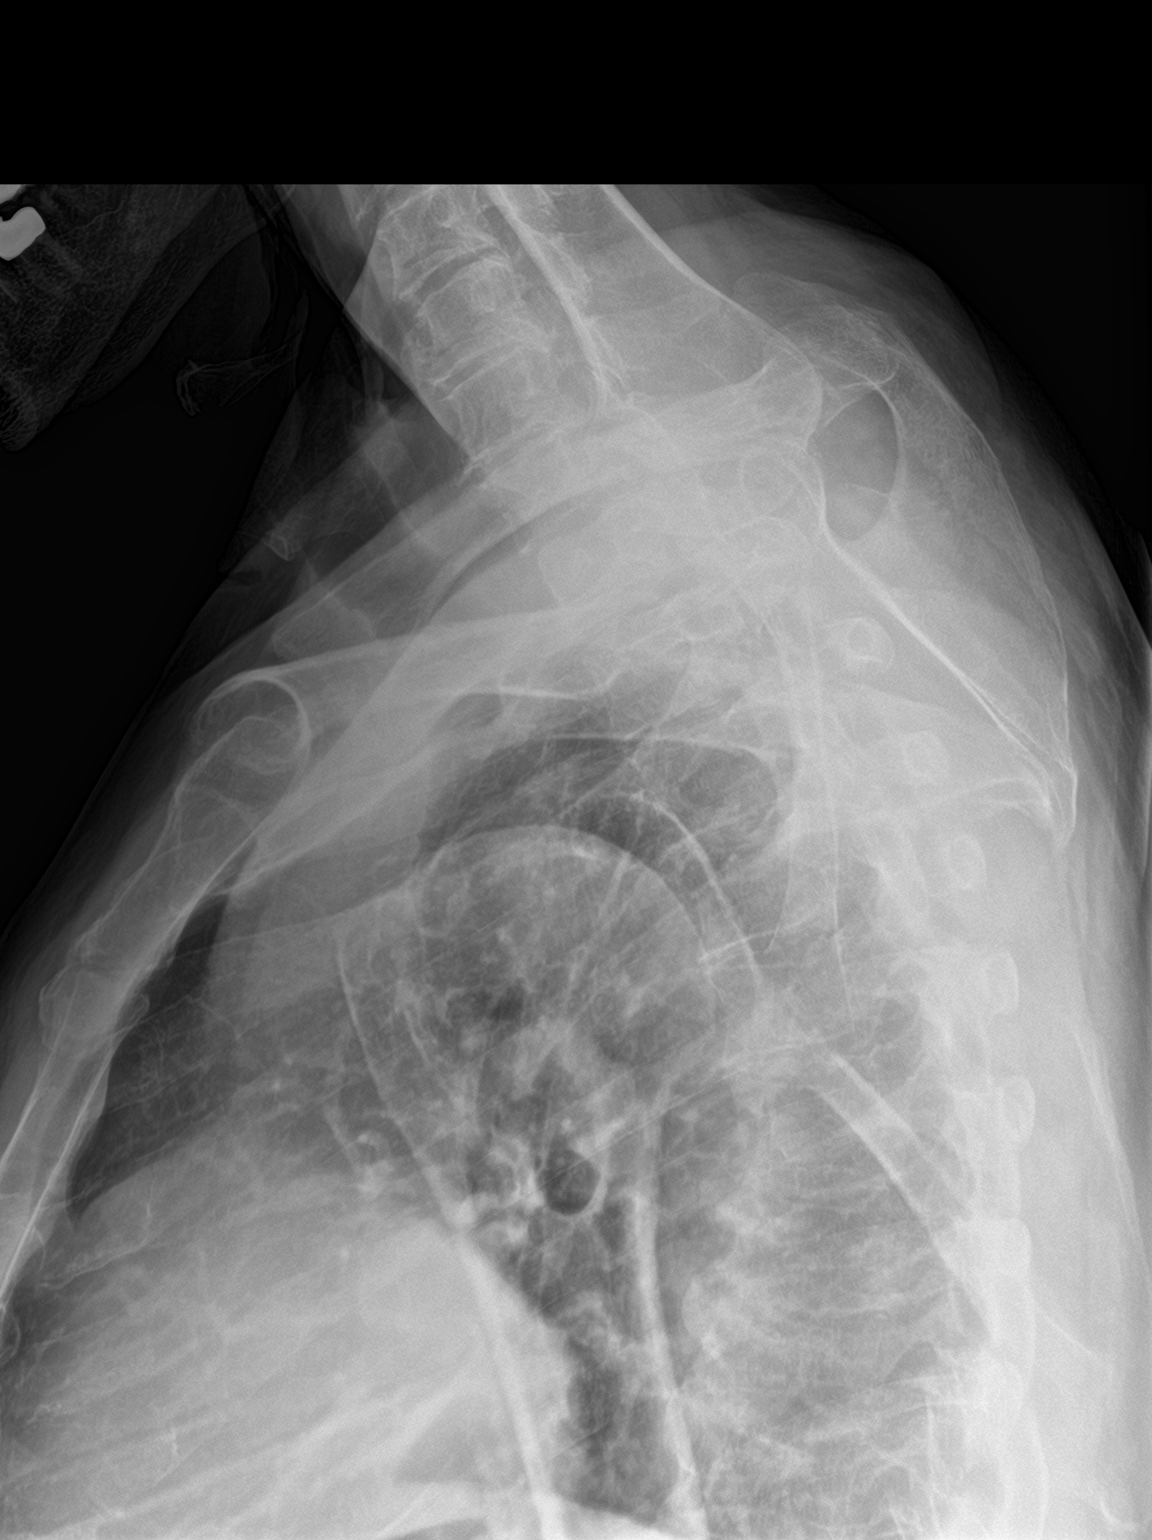

[3 of 3 positions shown; findings below may reference images not displayed]

FINDINGS: Mild scoliosis. Diffuse osteopenia. Multilevel disc degeneration
endplate osteophyte formation consistent degenerative change again
noted. No acute bony abnormality. No evidence of fracture.
IMPRESSION: Mild scoliosis. Diffuse osteopenia. Multilevel degenerative change.
No acute bony abnormality.

## 2022-06-20 ENCOUNTER — Other Ambulatory Visit: Payer: Self-pay | Admitting: Family Medicine

## 2022-07-11 ENCOUNTER — Other Ambulatory Visit: Payer: Self-pay | Admitting: Family Medicine

## 2022-10-24 DIAGNOSIS — L308 Other specified dermatitis: Secondary | ICD-10-CM | POA: Diagnosis not present

## 2022-10-24 DIAGNOSIS — L820 Inflamed seborrheic keratosis: Secondary | ICD-10-CM | POA: Diagnosis not present

## 2022-10-24 DIAGNOSIS — X32XXXD Exposure to sunlight, subsequent encounter: Secondary | ICD-10-CM | POA: Diagnosis not present

## 2022-10-24 DIAGNOSIS — L570 Actinic keratosis: Secondary | ICD-10-CM | POA: Diagnosis not present

## 2022-11-05 DIAGNOSIS — H26491 Other secondary cataract, right eye: Secondary | ICD-10-CM | POA: Diagnosis not present

## 2022-12-12 ENCOUNTER — Ambulatory Visit (INDEPENDENT_AMBULATORY_CARE_PROVIDER_SITE_OTHER): Payer: Medicare Other | Admitting: Family Medicine

## 2022-12-12 ENCOUNTER — Encounter: Payer: Self-pay | Admitting: Family Medicine

## 2022-12-12 VITALS — BP 128/76 | HR 54 | Temp 97.8°F | Ht 73.0 in | Wt 222.8 lb

## 2022-12-12 DIAGNOSIS — Z125 Encounter for screening for malignant neoplasm of prostate: Secondary | ICD-10-CM

## 2022-12-12 DIAGNOSIS — E785 Hyperlipidemia, unspecified: Secondary | ICD-10-CM

## 2022-12-12 DIAGNOSIS — Z1211 Encounter for screening for malignant neoplasm of colon: Secondary | ICD-10-CM

## 2022-12-12 DIAGNOSIS — I700 Atherosclerosis of aorta: Secondary | ICD-10-CM | POA: Diagnosis not present

## 2022-12-12 DIAGNOSIS — Z0000 Encounter for general adult medical examination without abnormal findings: Secondary | ICD-10-CM

## 2022-12-12 LAB — CBC WITH DIFFERENTIAL/PLATELET
Basophils Absolute: 0.1 10*3/uL (ref 0.0–0.1)
Basophils Relative: 1.1 % (ref 0.0–3.0)
Eosinophils Absolute: 0.5 10*3/uL (ref 0.0–0.7)
Eosinophils Relative: 10.1 % — ABNORMAL HIGH (ref 0.0–5.0)
HCT: 42.5 % (ref 39.0–52.0)
Hemoglobin: 15 g/dL (ref 13.0–17.0)
Lymphocytes Relative: 23.3 % (ref 12.0–46.0)
Lymphs Abs: 1.2 10*3/uL (ref 0.7–4.0)
MCHC: 35.3 g/dL (ref 30.0–36.0)
MCV: 91.3 fl (ref 78.0–100.0)
Monocytes Absolute: 0.6 10*3/uL (ref 0.1–1.0)
Monocytes Relative: 11.4 % (ref 3.0–12.0)
Neutro Abs: 2.8 10*3/uL (ref 1.4–7.7)
Neutrophils Relative %: 54.1 % (ref 43.0–77.0)
Platelets: 265 10*3/uL (ref 150.0–400.0)
RBC: 4.65 Mil/uL (ref 4.22–5.81)
RDW: 13 % (ref 11.5–15.5)
WBC: 5.2 10*3/uL (ref 4.0–10.5)

## 2022-12-12 LAB — LIPID PANEL
Cholesterol: 111 mg/dL (ref 0–200)
HDL: 44.7 mg/dL (ref >39.00)
LDL Cholesterol: 51 mg/dL (ref 0–99)
NonHDL: 66.01
Total CHOL/HDL Ratio: 2
Triglycerides: 74 mg/dL (ref 0.0–149.0)
VLDL: 14.8 mg/dL (ref 0.0–40.0)

## 2022-12-12 LAB — PSA, MEDICARE: PSA: 4 ng/ml (ref 0.10–4.00)

## 2022-12-12 LAB — COMPREHENSIVE METABOLIC PANEL
ALT: 19 U/L (ref 0–53)
AST: 28 U/L (ref 0–37)
Albumin: 4 g/dL (ref 3.5–5.2)
Alkaline Phosphatase: 60 U/L (ref 39–117)
BUN: 13 mg/dL (ref 6–23)
CO2: 32 mEq/L (ref 19–32)
Calcium: 9.6 mg/dL (ref 8.4–10.5)
Chloride: 101 mEq/L (ref 96–112)
Creatinine, Ser: 1.09 mg/dL (ref 0.40–1.50)
GFR: 65.5 mL/min (ref >60.00)
Glucose, Bld: 77 mg/dL (ref 70–99)
Potassium: 4.1 mEq/L (ref 3.5–5.1)
Sodium: 141 mEq/L (ref 135–145)
Total Bilirubin: 2.4 mg/dL — ABNORMAL HIGH (ref 0.2–1.2)
Total Protein: 6.6 g/dL (ref 6.0–8.3)

## 2022-12-12 NOTE — Patient Instructions (Addendum)
Team please log his covid shot- reports had in the fall  Please stop by lab before you go If you have mychart- we will send your results within 3 business days of [place] receiving them.  If you do not have mychart- we will call you about results within 5 business days of [place] receiving them.  *please also note that you will see labs on mychart as soon as they post. I will later go in and write notes on them- will say "notes from Dr. Yong Channel"   Recommended follow up: Return in about 1 year (around 12/12/2023) for physical or sooner if needed.Schedule b4 you leave. Make sure blood pressure < 135/85 at home if we are going ot hold off on every 6 month checkds

## 2022-12-12 NOTE — Progress Notes (Signed)
Phone: [phone]   Subjective:  Patient presents today for their annual physical. Chief complaint-noted.   See problem oriented charting- ROS- full  review of systems was completed and negative  except for: chronic cough, light sensitivity with eyes after cataract removal- even with sunglasses  The following were reviewed and entered/updated in epic: Past Medical History:  Diagnosis Date   Elevated PSA    with prostatitis   Gilbert's syndrome    Hemorrhoids, external    Hyperlipidemia    Spermatic cord cyst    Patient Active Problem List   Diagnosis Date Noted   Aortic atherosclerosis ([place]) 05/19/2020    Priority: Medium    Bradycardia 04/16/2018    Priority: Medium    Hypertension, essential 03/19/2018    Priority: Medium    Essential tremor 07/01/2014    Priority: Medium    Gilbert's syndrome 01/16/2013    Priority: Medium    Hyperlipidemia 04/25/2009    Priority: Medium    Prostate cancer screening 07/01/2014    Priority: Low   Other malaise and fatigue 04/26/2009    Priority: Low   Cough 03/19/2018   Past Surgical History:  Procedure Laterality Date   CATARACT EXTRACTION W/PHACO Left 06/03/2017   Procedure: CATARACT EXTRACTION PHACO AND INTRAOCULAR LENS PLACEMENT ([place]);  Surgeon: Tonny Branch, MD;  Location: [facility];  Service: Ophthalmology;  Laterality: Left;  CDE: 15.27   CATARACT EXTRACTION W/PHACO Right 09/02/2017   Procedure: CATARACT EXTRACTION PHACO AND INTRAOCULAR LENS PLACEMENT RIGHT EYE;  Surgeon: Tonny Branch, MD;  Location: [facility];  Service: Ophthalmology;  Laterality: Right;  CDE: 7.92   HAND SURGERY  2005   "lumps on head"    Family History  Problem Relation Age of Onset   Cancer Father        spinal 38   Cancer Son        testicular cancer   Cancer Paternal Grandfather        spinal 7   Prostate cancer Maternal Uncle        did not die of    Medications- reviewed and updated Current Outpatient Medications  Medication Sig Dispense  Refill   amLODipine (NORVASC) 2.5 MG tablet TAKE 1 TABLET(2.5 MG) BY MOUTH DAILY 90 tablet 3   hydrochlorothiazide (HYDRODIURIL) 12.5 MG tablet TAKE 1 TABLET(12.5 MG) BY MOUTH DAILY 90 tablet 3   rosuvastatin (CRESTOR) 20 MG tablet TAKE 1 TABLET(20 MG) BY MOUTH DAILY 90 tablet 3   No current facility-administered medications for this visit.    Allergies-reviewed and updated No Known Allergies  Social History   Social History Narrative   Married 1966. 2 sons Randall Hiss, not married and no kids, and Larkin Ina with 1 daughter- 31 in 2021, not married.      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       Hobbies: yardwork, working at [employer].    Objective  Objective:  BP 128/76   Pulse (!) 54   Temp 97.8 F (36.6 C)   Ht '6\' 1"'$  (1.854 m)   Wt 222 lb 12.8 oz (101.1 kg)   SpO2 97%   BMI 29.39 kg/m  Gen: NAD, resting comfortably HEENT: Mucous membranes are moist. Oropharynx normal Neck: no thyromegaly CV: RRR no murmurs rubs or gallops Lungs: CTAB no crackles, wheeze, rhonchi Abdomen: soft/nontender/nondistended/normal bowel sounds. No rebound or guarding.  Ext: no edema Skin: warm, dry Neuro: grossly normal, moves all extremities, PERRLA   Assessment and Plan  77 y.o. male presenting for annual physical.  Health Maintenance counseling: 1. Anticipatory guidance: Patient counseled regarding regular dental exams -q6 months, eye exams -yearly if needed mainly,  avoiding smoking and second hand smoke, limiting alcohol to 2 beverages per day- still not drinking- has not since retiremnt, no illicit drugs .   2. Risk factor reduction:  Advised patient of need for regular exercise and diet rich and fruits and vegetables to reduce risk of heart attack and stroke.  Exercise- not exercising regularly - some push ups and sit ups but enocuraged more regularly Diet/weight management-mild weight loss from last cpe- noted as overweight per bmi- trying to  watch what he is eating on home front .  Wt Readings from Last 3 Encounters:  12/12/22 222 lb 12.8 oz (101.1 kg)  01/19/22 220 lb 3.2 oz (99.9 kg)  05/22/21 219 lb 6.4 oz (99.5 kg)  3. Immunizations/screenings/ancillary studies- covid 19 vaccine had in the fall Immunization History  Administered Date(s) Administered   Influenza, High Dose Seasonal PF 07/30/2022   Influenza-Unspecified 07/27/2021   Moderna Sars-Covid-2 Vaccination 01/19/2020, 02/16/2020, 09/08/2020   Pneumococcal Conjugate-13 03/19/2018   Pneumococcal Polysaccharide-23 01/10/2012   Tdap 07/01/2014   Zoster, Live 05/02/2009   4. Prostate cancer screening-  psa came down on last visit- past age based screening recommendations- he prefers to continue. Uncles had prostate cancer Lab Results  Component Value Date   PSA 2.2 05/19/2020   PSA 3.40 04/13/2019   PSA 3.65 03/19/2018   5. Colon cancer screening - past age based screening recommendations . Last cologuard 2018. He would like to do stool cards 6. Skin cancer screening- seeing dermatology regularly. advised regular sunscreen use. Denies worrisome, changing, or new skin lesions.  7. Smoking associated screening (lung cancer screening, AAA screen 65-75, UA)- never smoker 8. STD screening - only active with wife  Status of chronic or acute concerns   #hypertension S: medication: amlodipine 2.5 mg, hydrochlorothiazide 12.5 mg  Home readings #s:  does not check much BP Readings from Last 3 Encounters:  12/12/22 128/76  01/19/22 100/64  05/22/21 134/78  A/P: stable- continue current medicines   #hyperlipidemia #aortic atherosclerosis S: Medication:rosuvastatin 20 mg  Lab Results  Component Value Date   CHOL 130 05/19/2020   HDL 47 05/19/2020   LDLCALC 67 05/19/2020   LDLDIRECT 177.5 01/14/2013   TRIG 80 05/19/2020   CHOLHDL 2.8 05/19/2020   A/P: cholesterol at goal- likely continue current medications - update lipids Aortic atherosclerosis (presumed  stable)- LDL goal ideally <70 - suspect at goal- continue current medications   #Gilbert's syndrome- will monitor bilirubin  #Chronic cough- at least back to 2018- did x-rays ta that time- seems to come and go- worse with certain seasons potentially. Worse at night when laying down. Declines CXR at this time. No fevers or unintentional weight loss  Recommended follow up: Return in about 1 year (around 12/12/2023) for physical or sooner if needed.Schedule b4 you leave. Future Appointments  Date Time Provider [place]  02/01/2023  9:00 AM [facility]   Lab/Order associations:NOT fasting   ICD-10-CM   1. Preventative health care  Z00.00     2. Hyperlipidemia, unspecified hyperlipidemia type  E78.5 CBC with Differential/Platelet    Comprehensive metabolic panel    Lipid panel    3. Aortic atherosclerosis (HCC)  I70.0     4. Screening for prostate cancer  Z12.5 PSA, Medicare ( [place] only)    5. Screen for  colon cancer  Z12.11 Fecal occult blood, imunochemical      No orders of the defined types were placed in this encounter.   Return precautions advised.  Garret Reddish, MD

## 2023-01-11 ENCOUNTER — Other Ambulatory Visit: Payer: Self-pay

## 2023-01-11 MED ORDER — HYDROCHLOROTHIAZIDE 12.5 MG PO TABS: ORAL_TABLET | ORAL | 3 refills | Status: DC

## 2023-03-15 ENCOUNTER — Ambulatory Visit: Payer: Medicare Other | Admitting: Sports Medicine

## 2023-03-15 ENCOUNTER — Ambulatory Visit (INDEPENDENT_AMBULATORY_CARE_PROVIDER_SITE_OTHER): Payer: Medicare Other

## 2023-03-15 VITALS — BP 102/80 | HR 64 | Ht 73.0 in | Wt 221.0 lb

## 2023-03-15 DIAGNOSIS — G8929 Other chronic pain: Secondary | ICD-10-CM | POA: Diagnosis not present

## 2023-03-15 DIAGNOSIS — M546 Pain in thoracic spine: Secondary | ICD-10-CM | POA: Diagnosis not present

## 2023-03-15 MED ORDER — MELOXICAM 15 MG PO TABS: 15.0000 mg | ORAL_TABLET | Freq: Every day | ORAL | 0 refills | Status: DC

## 2023-03-15 NOTE — Progress Notes (Signed)
    Aleen Sells D.[facility] [address] [place] [address] Phone: [phone]   Assessment and Plan:     1. Chronic bilateral thoracic back pain -Chronic with exacerbation, initial sports medicine visit - Suspect flare of degenerative changes throughout thoracic spine likely caused from either patient sleeping position or physical activity - X-ray obtained in clinic.  My interpretation: No acute fracture or dislocation.  Multilevel degenerative changes with mild scoliosis.  - Start HEP for upper back - Start meloxicam 15 mg daily x2 weeks.  If still having pain after 2 weeks, complete 3rd-week of meloxicam. May use remaining meloxicam as needed once daily for pain control.  Do not to use additional NSAIDs while taking meloxicam.  May use Tylenol [phone] mg 2 to 3 times a day for breakthrough pain.    Pertinent previous records reviewed include thoracic x-ray 05/22/2021   Follow Up: 4 weeks for reevaluation.  If no improvement or worsening of symptoms, could discuss OMT versus physical therapy versus advanced imaging   Subjective:   I, Henry Kirk, am serving as a Neurosurgeon for Doctor Richardean Sale  Chief Complaint: back pain   HPI:   03/15/23 Patient is a 77 year old male complaining of back pain. Patient states that he has back pain for about a month or so intermittent flares, the  pain is not going away, thoracic back pain , a year ago he was working on his sink on his back , no meds for the pain , only has pain when he twist and rotates , no radiating pain, no numbness or tingling, no pain when taking a deep breath its more pressure when he does, wife states she can see some swelling in his spine   Relevant Historical Information: Hypertension, Gilbert's syndrome  Additional pertinent review of systems negative.   Current Outpatient Medications:    amLODipine (NORVASC) 2.5 MG tablet, TAKE 1 TABLET(2.5 MG) BY MOUTH DAILY, Disp: 90  tablet, Rfl: 3   hydrochlorothiazide (HYDRODIURIL) 12.5 MG tablet, TAKE 1 TABLET(12.5 MG) BY MOUTH DAILY, Disp: 90 tablet, Rfl: 3   meloxicam (MOBIC) 15 MG tablet, Take 1 tablet (15 mg total) by mouth daily., Disp: 30 tablet, Rfl: 0   rosuvastatin (CRESTOR) 20 MG tablet, TAKE 1 TABLET(20 MG) BY MOUTH DAILY, Disp: 90 tablet, Rfl: 3   Objective:     Vitals:   03/15/23 1023  BP: 102/80  Pulse: 64  SpO2: 95%  Weight: 221 lb (100.2 kg)  Height: 6\' 1"  (1.854 m)      Body mass index is 29.16 kg/m.    Physical Exam:    Gen: Appears well, nad, nontoxic and pleasant Psych: Alert and oriented, appropriate mood and affect Neuro: sensation intact, strength is 5/5 in upper and lower extremities, muscle tone wnl Skin: no susupicious lesions or rashes  Back - Normal skin, Spine with normal alignment and no deformity.   No tenderness to vertebral process palpation.   Paraspinous muscles are not tender and without spasm, though patient locates his typical pain being around T6-T7 level Gait normal   Electronically signed by:  Aleen Sells D.[facility] 10:50 AM 03/15/23

## 2023-03-15 NOTE — Patient Instructions (Addendum)
Good to see you  - Start meloxicam 15 mg daily x2 weeks.  If still having pain after 2 weeks, complete 3rd-week of meloxicam. May use remaining meloxicam as needed once daily for pain control.  Do not to use additional NSAIDs while taking meloxicam.  May use Tylenol [phone] mg 2 to 3 times a day for breakthrough pain. Thoracic HEP  4 week follow up

## 2023-04-10 ENCOUNTER — Ambulatory Visit: Payer: Medicare Other | Admitting: Sports Medicine

## 2023-04-13 ENCOUNTER — Other Ambulatory Visit: Payer: Self-pay | Admitting: Sports Medicine

## 2023-05-06 ENCOUNTER — Ambulatory Visit: Payer: Medicare Other | Admitting: Family Medicine

## 2023-05-08 ENCOUNTER — Ambulatory Visit (INDEPENDENT_AMBULATORY_CARE_PROVIDER_SITE_OTHER): Payer: Medicare Other | Admitting: Family Medicine

## 2023-05-08 ENCOUNTER — Encounter: Payer: Self-pay | Admitting: Family Medicine

## 2023-05-08 VITALS — BP 122/68 | HR 61 | Temp 97.0°F | Ht 73.0 in | Wt 215.4 lb

## 2023-05-08 DIAGNOSIS — Z1211 Encounter for screening for malignant neoplasm of colon: Secondary | ICD-10-CM

## 2023-05-08 DIAGNOSIS — M47814 Spondylosis without myelopathy or radiculopathy, thoracic region: Secondary | ICD-10-CM | POA: Insufficient documentation

## 2023-05-08 DIAGNOSIS — I10 Essential (primary) hypertension: Secondary | ICD-10-CM

## 2023-05-08 DIAGNOSIS — I700 Atherosclerosis of aorta: Secondary | ICD-10-CM

## 2023-05-08 DIAGNOSIS — E785 Hyperlipidemia, unspecified: Secondary | ICD-10-CM

## 2023-05-08 DIAGNOSIS — R972 Elevated prostate specific antigen [PSA]: Secondary | ICD-10-CM

## 2023-05-08 LAB — PSA, MEDICARE: PSA: 3.49 ng/ml (ref 0.10–4.00)

## 2023-05-08 NOTE — Progress Notes (Signed)
Phone [phone] In person visit   Subjective:   Henry Kirk is a 77 y.o. year old very pleasant male patient who presents for/with See problem oriented charting Chief Complaint  Patient presents with   imaging results    Wants to discuss imaging results from Dr. Jean Rosenthal   Elevated PSA    Wants to f/u on PSA   Nev[place]    Pt wants you to look at a mole on his chest.    Past Medical History-  Patient Active Problem List   Diagnosis Date Noted   Degenerative arthritis of thoracic spine 05/08/2023    Priority: Medium    Aortic atherosclerosis (HCC) 05/19/2020    Priority: Medium    Bradycardia 04/16/2018    Priority: Medium    Hypertension, essential 03/19/2018    Priority: Medium    Essential tremor 07/01/2014    Priority: Medium    Gilbert's syndrome 01/16/2013    Priority: Medium    Hyperlipidemia 04/25/2009    Priority: Medium    Prostate cancer screening 07/01/2014    Priority: Low   Other malaise and fatigue 04/26/2009    Priority: Low   Cough 03/19/2018    Medications- reviewed and updated Current Outpatient Medications  Medication Sig Dispense Refill   amLODipine (NORVASC) 2.5 MG tablet TAKE 1 TABLET(2.5 MG) BY MOUTH DAILY 90 tablet 3   hydrochlorothiazide (HYDRODIURIL) 12.5 MG tablet TAKE 1 TABLET(12.5 MG) BY MOUTH DAILY 90 tablet 3   rosuvastatin (CRESTOR) 20 MG tablet TAKE 1 TABLET(20 MG) BY MOUTH DAILY 90 tablet 3   No current facility-administered medications for this visit.     Objective:  BP 122/68   Pulse 61   Temp (!) 97 F (36.1 C)   Ht 6\' 1"  (1.854 m)   Wt 215 lb 6.4 oz (97.7 kg)   SpO2 97%   BMI 28.42 kg/m  Gen: NAD, resting comfortably CV: RRR no murmurs rubs or gallops Lungs: CTAB no crackles, wheeze, rhonchi Ext: no edema despite amlodipine Skin: warm, dry, several seborrheic keratosis throughout upper chest but on right upper chest there is a lesion that is a darker color, more raised and seems to have a more erythematous  base-patient agrees to call dermatology to discuss further-see below    Assessment and Plan   # Imaging follow-up for thoracic arthritis S: Had thoracic spine x-rays on 03/15/2023-showed moderate multilevel degenerative changes but no fractures or masses -She was started on home exercise program for upper back and thoracic spine.  He had taken a course of meloxicam as well (reports minimal benefit)  Plan was for follow-up if did not make significant strides. Has done a few of the exercises but feels like some are awkward.  -has tweaked the way he slept and has noted no pain in chest- that he had noted some months ago. Suspect was positional.  A/P: thoracic arthritis- he is going of the try to do the exercises at least on intermittent issue to help this.    #hyperlipidemia # Aortic atherosclerosis S: Medication:Rosuvastatin 20 mg Lab Results  Component Value Date   CHOL 111 12/12/2022   HDL 44.70 12/12/2022   LDLCALC 51 12/12/2022   LDLDIRECT 177.5 01/14/2013   TRIG 74.0 12/12/2022   CHOLHDL 2 12/12/2022   A/P: lipids looked great last visit- continue current medications Aortic atherosclerosis (presumed stable)- LDL goal ideally <70 - continue current medications as well as at goal    #hypertension S: medication: Amlodipine 2.5 mg, hydrochlorothiazide 12.5 mg  BP Readings from Last 3 Encounters:  05/08/23 122/68  03/15/23 102/80  12/12/22 128/76  A/P: stable- continue current medicines    # Elevated PSA S: PSAs have ranged over the years from low twos to mid threes but most recent PSA 4 months ago was up to 4.0-recommend referral to urology for their expert opinion -years ago reports antibiotics were helpful to bring PSA back down- he had to take some for a root canal and wants to check Lab Results  Component Value Date   PSA 4.00 12/12/2022   PSA 2.2 05/19/2020   PSA 3.40 04/13/2019   A/P: hopefully improved- wants to check PSA today and I think that's very reasonable- he would  agree to urology visit if worsens    # Mole on chest S:history of several seborrheic keratosis- but on eon right chest is darker and has red base   A/P: possibly just inflamed seborrheic keratosis but with darker color compared to surrounding lesions and then red base- I encouraged him to schedule follow up with Dr. Margo Aye to look at spot on right upper chest   #colon cancer screening - prefers cologuard- will order today- discussed may not be c[place] 74 so he will check before submitting   Recommended follow up: Return for next already scheduled visit or sooner if needed. Future Appointments  Date Time Provider Department Center  01/03/2024  9:20 AM Shelva Majestic, MD [facility]    Lab/Order associations:   ICD-10-CM   1. Screen for colon cancer  Z12.11 Cologuard    2. Elevated PSA  R97.20 PSA, Medicare ( [place] only)    3. Hypertension, essential  I10     4. Hyperlipidemia, unspecified hyperlipidemia type  E78.5     5. Aortic atherosclerosis (HCC)  I70.0     6. Spondylosis of thoracic region without myelopathy or radiculopathy  M47.814      Return precautions advised.  Tana Conch, MD

## 2023-05-08 NOTE — Patient Instructions (Addendum)
Let us know if you get your [facility] or COVID or flu  vaccine this fall at the pharmacy.  please let us know if you have not received your Cologuard within 3 weeks-please complete after you receive. I would double check with your insurance to make sure they cover it past 75.   possibly just inflamed seborrheic keratosis but with darker color compared to surrounding lesions and then red base- I encouraged him to schedule follow up with Dr. Margo Aye to look at spot on right upper chest   Please stop by lab before you go If you have mychart- we will send your results within 3 business days of [place] receiving them.  If you do not have mychart- we will call you about results within 5 business days of [place] receiving them.  *please also note that you will see labs on mychart as soon as they post. I will later go in and write notes on them- will say "notes from Dr. Durene Cal"    Recommended follow up: Return for next already scheduled visit or sooner if needed.

## 2023-05-21 DIAGNOSIS — Z1211 Encounter for screening for malignant neoplasm of colon: Secondary | ICD-10-CM | POA: Diagnosis not present

## 2023-06-03 DIAGNOSIS — L570 Actinic keratosis: Secondary | ICD-10-CM | POA: Diagnosis not present

## 2023-06-03 DIAGNOSIS — B078 Other viral warts: Secondary | ICD-10-CM | POA: Diagnosis not present

## 2023-06-03 DIAGNOSIS — L820 Inflamed seborrheic keratosis: Secondary | ICD-10-CM | POA: Diagnosis not present

## 2023-06-03 DIAGNOSIS — X32XXXD Exposure to sunlight, subsequent encounter: Secondary | ICD-10-CM | POA: Diagnosis not present

## 2023-07-11 ENCOUNTER — Other Ambulatory Visit: Payer: Self-pay | Admitting: Family Medicine

## 2023-07-20 ENCOUNTER — Other Ambulatory Visit: Payer: Self-pay | Admitting: Family Medicine

## 2023-11-04 DIAGNOSIS — L308 Other specified dermatitis: Secondary | ICD-10-CM | POA: Diagnosis not present

## 2023-11-04 DIAGNOSIS — L820 Inflamed seborrheic keratosis: Secondary | ICD-10-CM | POA: Diagnosis not present

## 2023-11-04 DIAGNOSIS — D485 Neoplasm of uncertain behavior of skin: Secondary | ICD-10-CM | POA: Diagnosis not present

## 2024-01-03 ENCOUNTER — Encounter: Payer: Self-pay | Admitting: Family Medicine

## 2024-01-03 ENCOUNTER — Other Ambulatory Visit (INDEPENDENT_AMBULATORY_CARE_PROVIDER_SITE_OTHER)

## 2024-01-03 ENCOUNTER — Ambulatory Visit (INDEPENDENT_AMBULATORY_CARE_PROVIDER_SITE_OTHER): Payer: Medicare Other | Admitting: Family Medicine

## 2024-01-03 VITALS — BP 128/64 | HR 53 | Temp 97.9°F | Ht 73.0 in | Wt 220.4 lb

## 2024-01-03 DIAGNOSIS — Z125 Encounter for screening for malignant neoplasm of prostate: Secondary | ICD-10-CM

## 2024-01-03 DIAGNOSIS — I700 Atherosclerosis of aorta: Secondary | ICD-10-CM

## 2024-01-03 DIAGNOSIS — E663 Overweight: Secondary | ICD-10-CM | POA: Diagnosis not present

## 2024-01-03 DIAGNOSIS — I10 Essential (primary) hypertension: Secondary | ICD-10-CM | POA: Diagnosis not present

## 2024-01-03 DIAGNOSIS — Z0000 Encounter for general adult medical examination without abnormal findings: Secondary | ICD-10-CM | POA: Diagnosis not present

## 2024-01-03 DIAGNOSIS — Z131 Encounter for screening for diabetes mellitus: Secondary | ICD-10-CM

## 2024-01-03 DIAGNOSIS — E785 Hyperlipidemia, unspecified: Secondary | ICD-10-CM | POA: Diagnosis not present

## 2024-01-03 LAB — LIPID PANEL
Cholesterol: 119 mg/dL (ref 0–200)
HDL: 44.8 mg/dL (ref >39.00)
LDL Cholesterol: 64 mg/dL (ref 0–99)
NonHDL: 74.49
Total CHOL/HDL Ratio: 3
Triglycerides: 54 mg/dL (ref 0.0–149.0)
VLDL: 10.8 mg/dL (ref 0.0–40.0)

## 2024-01-03 LAB — URINALYSIS, ROUTINE W REFLEX MICROSCOPIC
Bilirubin Urine: NEGATIVE
Hgb urine dipstick: NEGATIVE
Ketones, ur: NEGATIVE
Nitrite: NEGATIVE
Specific Gravity, Urine: 1.02 (ref 1.000–1.030)
Total Protein, Urine: NEGATIVE
Urine Glucose: NEGATIVE
Urobilinogen, UA: 1 (ref 0.0–1.0)
pH: 6.5 (ref 5.0–8.0)

## 2024-01-03 LAB — CBC WITH DIFFERENTIAL/PLATELET
Basophils Absolute: 0 10*3/uL (ref 0.0–0.1)
Basophils Relative: 0.7 % (ref 0.0–3.0)
Eosinophils Absolute: 0 10*3/uL (ref 0.0–0.7)
Eosinophils Relative: 0.6 % (ref 0.0–5.0)
HCT: 40.6 % (ref 39.0–52.0)
Hemoglobin: 13.6 g/dL (ref 13.0–17.0)
Lymphocytes Relative: 24.2 % (ref 12.0–46.0)
Lymphs Abs: 1.2 10*3/uL (ref 0.7–4.0)
MCHC: 33.4 g/dL (ref 30.0–36.0)
MCV: 85.7 fl (ref 78.0–100.0)
Monocytes Absolute: 0.5 10*3/uL (ref 0.1–1.0)
Monocytes Relative: 9.1 % (ref 3.0–12.0)
Neutro Abs: 3.3 10*3/uL (ref 1.4–7.7)
Neutrophils Relative %: 65.4 % (ref 43.0–77.0)
Platelets: 280 10*3/uL (ref 150.0–400.0)
RBC: 4.73 Mil/uL (ref 4.22–5.81)
RDW: 16.3 % — ABNORMAL HIGH (ref 11.5–15.5)
WBC: 5.1 10*3/uL (ref 4.0–10.5)

## 2024-01-03 LAB — PSA, MEDICARE: PSA: 3.35 ng/mL (ref 0.10–4.00)

## 2024-01-03 LAB — COMPREHENSIVE METABOLIC PANEL WITH GFR
ALT: 23 U/L (ref 0–53)
AST: 35 U/L (ref 0–37)
Albumin: 4.3 g/dL (ref 3.5–5.2)
Alkaline Phosphatase: 53 U/L (ref 39–117)
BUN: 17 mg/dL (ref 6–23)
CO2: 31 meq/L (ref 19–32)
Calcium: 9.5 mg/dL (ref 8.4–10.5)
Chloride: 105 meq/L (ref 96–112)
Creatinine, Ser: 1.1 mg/dL (ref 0.40–1.50)
GFR: 64.31 mL/min (ref >60.00)
Glucose, Bld: 78 mg/dL (ref 70–99)
Potassium: 4.2 meq/L (ref 3.5–5.1)
Sodium: 144 meq/L (ref 135–145)
Total Bilirubin: 2 mg/dL — ABNORMAL HIGH (ref 0.2–1.2)
Total Protein: 6.7 g/dL (ref 6.0–8.3)

## 2024-01-03 MED ORDER — HYDROCHLOROTHIAZIDE 12.5 MG PO TABS: ORAL_TABLET | ORAL | 3 refills | Status: DC

## 2024-01-03 NOTE — Patient Instructions (Addendum)
 Spot on cheek- he's going to monitor and try warm compresses and if not better in 1-2 weeks will see dermatology   Consider shingrix at pharmacy   Please stop by lab before you go If you have mychart- we will send your results within 3 business days of [place] receiving them.  If you do not have mychart- we will call you about results within 5 business days of [place] receiving them.  *please also note that you will see labs on mychart as soon as they post. I will later go in and write notes on them- will say "notes from Dr. Durene Cal"   Recommended follow up: Return in about 6 months (around 07/05/2024) for followup or sooner if needed.Schedule b4 you leave.

## 2024-01-03 NOTE — Progress Notes (Signed)
 Phone: [phone]   Subjective:  Patient presents today for their annual physical. Chief complaint-noted.   See problem oriented charting- ROS- full  review of systems was completed and negative  Per full ROS sheet completed by patient other than tremor in hands and head at times  The following were reviewed and entered/updated in epic: Past Medical History:  Diagnosis Date   Elevated PSA    with prostatitis   Gilbert's syndrome    Hemorrhoids, external    Hyperlipidemia    Spermatic cord cyst    Patient Active Problem List   Diagnosis Date Noted   Degenerative arthritis of thoracic spine 05/08/2023    Priority: Medium    Aortic atherosclerosis (HCC) 05/19/2020    Priority: Medium    Bradycardia 04/16/2018    Priority: Medium    Hypertension, essential 03/19/2018    Priority: Medium    Essential tremor 07/01/2014    Priority: Medium    Gilbert's syndrome 01/16/2013    Priority: Medium    Hyperlipidemia 04/25/2009    Priority: Medium    Prostate cancer screening 07/01/2014    Priority: Low   Other malaise and fatigue 04/26/2009    Priority: Low   Cough 03/19/2018   Past Surgical History:  Procedure Laterality Date   CATARACT EXTRACTION W/PHACO Left 06/03/2017   Procedure: CATARACT EXTRACTION PHACO AND INTRAOCULAR LENS PLACEMENT (IOC);  Surgeon: Gemma Payor, MD;  Location: [facility];  Service: Ophthalmology;  Laterality: Left;  CDE: 15.27   CATARACT EXTRACTION W/PHACO Right 09/02/2017   Procedure: CATARACT EXTRACTION PHACO AND INTRAOCULAR LENS PLACEMENT RIGHT EYE;  Surgeon: Gemma Payor, MD;  Location: [facility];  Service: Ophthalmology;  Laterality: Right;  CDE: 7.92   HAND SURGERY  2005   "lumps on head"    Family History  Problem Relation Age of Onset   Cancer Father        spinal 24   Cancer Son        testicular cancer   Cancer Paternal Grandfather        spinal 68   Prostate cancer Maternal Uncle        did not die of    Medications- reviewed and  updated Current Outpatient Medications  Medication Sig Dispense Refill   amLODipine (NORVASC) 2.5 MG tablet TAKE 1 TABLET(2.5 MG) BY MOUTH DAILY 90 tablet 3   hydrochlorothiazide (HYDRODIURIL) 12.5 MG tablet TAKE 1 TABLET(12.5 MG) BY MOUTH DAILY 90 tablet 3   rosuvastatin (CRESTOR) 20 MG tablet TAKE 1 TABLET(20 MG) BY MOUTH DAILY 90 tablet 3   No current facility-administered medications for this visit.    Allergies-reviewed and updated No Known Allergies  Social History   Social History Narrative   Married 1966. 2 sons Minerva Areola, not married and no kids, and Jill Alexanders with 1 daughter- 15 in 2021, not married.      Retired from work Personnel officer at [employer]. Very active working at home.       Hobbies: yardwork, working at [employer].    Objective  Objective:  BP 128/64   Pulse (!) 53   Temp 97.9 F (36.6 C)   Ht 6\' 1"  (1.854 m)   Wt 220 lb 6.4 oz (100 kg)   SpO2 95%   BMI 29.08 kg/m  Gen: NAD, resting comfortably HEENT: Mucous membranes are moist. Oropharynx normal Neck: no thyromegaly CV: RRR no murmurs rubs or gallops Lungs: CTAB no crackles, wheeze, rhonchi Abdomen: soft/nontender/nondistended/normal bowel sounds. No rebound or guarding.  Ext: no edema  Skin: warm, dry Neuro: grossly normal, moves all extremities, PERRLA Declines genitourinary and rectal exam     Assessment and Plan  78 y.o. male presenting for annual physical.  Health Maintenance counseling: 1. Anticipatory guidance: Patient counseled regarding regular dental exams -q6 months, eye exams - advised updating- hasn't been recently- about 2 years,  avoiding smoking and second hand smoke, limiting alcohol to 2 beverages per day - continues to not drink since retirement, no illicit drugs .   2. Risk factor reduction:  Advised patient of need for regular exercise and diet rich and fruits and vegetables to reduce risk of heart attack and stroke.  Exercise- some yard work- encouraged to try to get  afternoon exercise in since that would potentially work for him- may try to do some leg lifts and push ups- encouraged squats and walking as well.  Diet/weight management-weight down 2 lbs from last year- tends to lose more weight in summer months.  Wt Readings from Last 3 Encounters:  01/03/24 220 lb 6.4 oz (100 kg)  05/08/23 215 lb 6.4 oz (97.7 kg)  03/15/23 221 lb (100.2 kg)  3. Immunizations/screenings/ancillary studies- holding off on shingrix but otherwise up to date  Immunization History  Administered Date(s) Administered   Influenza, High Dose Seasonal PF 07/30/2022   Influenza-Unspecified 07/27/2021, 07/22/2023   Moderna Covid-19 Fall Seasonal Vaccine 26yrs & older 07/22/2023   Moderna Sars-Covid-2 Vaccination 01/19/2020, 02/16/2020, 09/08/2020   Pneumococcal Conjugate-13 03/19/2018   Pneumococcal Polysaccharide-23 01/10/2012   Tdap 07/01/2014   Zoster, Live 05/02/2009  4. Prostate cancer screening- due to uncle's history of prostate cancer he prefers to continue screening-fortunately PSA came down at July visit after going up in June 2024.  Baseline appears to be around 3.5 but did get as low as 2.2 in August 2021 Lab Results  Component Value Date   PSA 3.49 05/08/2023   PSA 4.00 12/12/2022   PSA 2.2 05/19/2020   5. Colon cancer screening - cologuard negative in 2024- no repeat at his age 56. Skin cancer screening-sees dermatology as needed- Dr. Margo Aye. advised regular sunscreen use. Denies worrisome, changing, or new skin lesions- other than small bump on left cheek- feels like pimple/small abscess - he's going to monitor and try warm compresses and if not better in 1-2 weeks will see dermatology  7. Smoking associated screening (lung cancer screening, AAA screen 65-75, UA)-never smoker 8. STD screening -only active with wife  Status of chronic or acute concerns   #essential tremor- notes issues holding coffee for instance- worse in left hand- tries to use right but mild issues-  also notes in the head. Gradual progression over most of life.  - no resting tremor - also gets jittery if doesn't eat right at 12- worse if eats something like pancakes and syrups- better if avoids syrups  #hypertension S: medication: Amlodipine 2.5 mg, hydrochlorothiazide 12.5 mg in am  BP Readings from Last 3 Encounters:  01/03/24 128/64  05/08/23 122/68  03/15/23 102/80  A/P: well controlled continue current medications    #hyperlipidemia # Aortic atherosclerosis S: Medication:rosuvastatin 20 mg daily  Lab Results  Component Value Date   CHOL 111 12/12/2022   HDL 44.70 12/12/2022   LDLCALC 51 12/12/2022   LDLDIRECT 177.5 01/14/2013   TRIG 74.0 12/12/2022   CHOLHDL 2 12/12/2022   A/P: aortic atherosclerosis (presumed stable)- LDL goal ideally <70 -at goal-update level today  # Gilbert's syndrome-monitor bilirubin  # Chronic cough-dates back to at least 2018.  That comes  and goes.  Chest x-ray is reassuring in 2018.  Worse seasonally.  Offered chest x-ray last year and he declines.  Worse with laying down at night.  No fevers or unintentional weight loss.  Offered x-ray again today- he declins- feels like allergy related potentially- overall stable to beter   Recommended follow up: Return in about 6 months (around 07/05/2024) for followup or sooner if needed.Schedule b4 you leave.  Lab/Order associations:NOT fasting- light breakfast   ICD-10-CM   1. Preventative health care  Z00.00     2. Screening for prostate cancer  Z12.5 PSA, Medicare    3. Screening for diabetes mellitus  Z13.1 Hemoglobin A1c    4. Overweight  E66.3 Hemoglobin A1c    5. Hyperlipidemia, unspecified hyperlipidemia type  E78.5 Comprehensive metabolic panel with GFR    CBC with Differential/Platelet    Lipid panel    6. Hypertension, essential  I10 Comprehensive metabolic panel with GFR    CBC with Differential/Platelet    Lipid panel    Urinalysis, Routine w reflex microscopic    7. Aortic  atherosclerosis (HCC)  I70.0       No orders of the defined types were placed in this encounter.   Return precautions advised.  Tana Conch, MD

## 2024-01-03 NOTE — Addendum Note (Signed)
 Addended by: Gwenette Greet on: 01/03/2024 10:12 AM   Modules accepted: Orders

## 2024-01-04 LAB — HEMOGLOBIN A1C: Hgb A1c MFr Bld: 5.6 % (ref 4.6–6.5)

## 2024-01-06 ENCOUNTER — Encounter: Payer: Self-pay | Admitting: Family Medicine

## 2024-04-12 ENCOUNTER — Other Ambulatory Visit: Payer: Self-pay | Admitting: Family Medicine

## 2024-07-07 ENCOUNTER — Encounter: Payer: Self-pay | Admitting: Family Medicine

## 2024-07-07 ENCOUNTER — Ambulatory Visit (INDEPENDENT_AMBULATORY_CARE_PROVIDER_SITE_OTHER): Admitting: Family Medicine

## 2024-07-07 VITALS — BP 132/80 | HR 66 | Temp 98.0°F | Ht 73.0 in | Wt 213.0 lb

## 2024-07-07 DIAGNOSIS — E785 Hyperlipidemia, unspecified: Secondary | ICD-10-CM | POA: Diagnosis not present

## 2024-07-07 DIAGNOSIS — I10 Essential (primary) hypertension: Secondary | ICD-10-CM

## 2024-07-07 DIAGNOSIS — G250 Essential tremor: Secondary | ICD-10-CM

## 2024-07-07 MED ORDER — AMLODIPINE BESYLATE 2.5 MG PO TABS: 2.5000 mg | ORAL_TABLET | Freq: Every day | ORAL | 3 refills | Status: DC

## 2024-07-07 MED ORDER — ROSUVASTATIN CALCIUM 20 MG PO TABS: ORAL_TABLET | ORAL | 3 refills | Status: DC

## 2024-07-07 MED ORDER — HYDROCHLOROTHIAZIDE 12.5 MG PO TABS: ORAL_TABLET | ORAL | 3 refills | Status: AC

## 2024-07-07 NOTE — Progress Notes (Signed)
 Phone [phone] In person visit   Subjective:   Henry Kirk is a 78 y.o. year old very pleasant male patient who presents for/with See problem oriented charting Chief Complaint  Patient presents with   Hypertension   Joint Pain    Hand and neck pain x1 month;    Medical Management of Chronic Issues    Will be getting tetanus at pharmacy;    Discussed the use of AI scribe software for clinical note transcription with the patient, who gave verbal consent to proceed.  History of Present Illness   Henry Kirk is a 78 year old male with hypertension and hyperlipidemia who presents for a follow-up visit.  He is managing hypertension and hyperlipidemia with amlodipine  2.5 mg, hydrochlorothiazide  12.5 mg, and rosuvastatin . He needs medication refills and has experienced issues with pharmacy notifications regarding refills. His LDL was last recorded at 64.  He has a history of plaque on his aorta and takes statin therapy. No chest pain or shortness of breath associated with his symptoms. He attempts to maintain hydration with a routine intake of various fluids throughout the day but may be lower on large than he needs to be.  He experiences a chronic cough that has persisted for seven years at least, sometimes aggravated by sneezing and possibly related to allergies. The cough does not worsen when lying down per report today but previously had reported this and fluctuates in severity. Previous x-rays have been normal in 2018.  He has a tremor in his left hand, affecting his ability to hold objects steadily. The tremor is also present in his head and neck but is less bothersome. He compensates by using his right hand for tasks requiring steadiness.   He reports neck pain on the left side, exacerbated by quick movements. The pain is localized to the neck and does not radiate to the arm. He also experiences stiffness and pain in his right thumb and both hands, which he attributes to  sleeping positions. The left hand had increased pain and weakness recently, but strength has improved.  He is not sure what triggered this  He has a history of Gilbert syndrome, which is monitored through bilirubin levels. He also mentions a history of slightly elevated bilirubin levels.        Past Medical History-  Patient Active Problem List   Diagnosis Date Noted   Aortic atherosclerosis 05/19/2020    Priority: Medium    Bradycardia 04/16/2018    Priority: Medium    Hypertension, essential 03/19/2018    Priority: Medium    Essential tremor 07/01/2014    Priority: Medium    Gilbert's syndrome 01/16/2013    Priority: Medium    Hyperlipidemia 04/25/2009    Priority: Medium    Prostate cancer screening 07/01/2014    Priority: Low   Other malaise and fatigue 04/26/2009    Priority: Low   Degenerative arthritis of thoracic spine 05/08/2023    Priority: 1.   Cough 03/19/2018    Medications- reviewed and updated Current Outpatient Medications  Medication Sig Dispense Refill   amLODipine  (NORVASC ) 2.5 MG tablet Take 1 tablet (2.5 mg total) by mouth daily. 90 tablet 3   hydrochlorothiazide  (HYDRODIURIL ) 12.5 MG tablet TAKE 1 TABLET(12.5 MG) BY MOUTH DAILY 90 tablet 3   rosuvastatin  (CRESTOR ) 20 MG tablet TAKE 1 TABLET(20 MG) BY MOUTH DAILY 90 tablet 3   No current facility-administered medications for this visit.     Objective:  BP 132/80 (BP Location:  Left Arm, Patient Position: Sitting, Cuff Size: Normal)   Pulse 66   Temp 98 F (36.7 C) (Temporal)   Ht 6' 1 (1.854 m)   Wt 213 lb (96.6 kg)   SpO2 98%   BMI 28.10 kg/m  Gen: NAD, resting comfortably CV: RRR no murmurs rubs or gallops Lungs: CTAB no crackles, wheeze, rhonchi Ext: minimal edema Skin: warm, dry Normal strength in upper extremities. Negative spurlings. Negative tinel and phalen.     Assessment and Plan   # Health maintenance-plans on tetanus shot at pharmacy     Essential hypertension Blood  pressure is well-controlled at 132/80 mmHg. Continues on amlodipine  2.5 mg and hydrochlorothiazide  12.5 mg without issues. Current levels are satisfactory. - Renew amlodipine  2.5 mg with 90-day supply and 3 refills. - Renew hydrochlorothiazide  12.5 mg with 90-day supply and 3 refills.  Hyperlipidemia with aortic atherosclerotic plaque LDL cholesterol is well-controlled at 64 mg/dL, below the target of 70 mg/dL. Rosuvastatin  stabilizes aortic plaque and reduces cardiovascular risk. - Renew rosuvastatin  with 90-day supply and 3 refills.  Essential tremor Essential tremor affects the left hand, occasionally involving the head and neck. Symptoms do not significantly impact daily activities. He prefers to monitor symptoms rather than initiate treatment.  Left neck pain and left hand pain and stiffness Left neck pain with crepitus and left hand pain and stiffness but no clear radicular symptoms.  Hand symptoms have improved, but thumb pain persists on the right side, likely due to arthritis of thumb MCP  joint. Differential includes cervical disc issues and carpal tunnel syndrome, but tests for radicular symptoms and carpal tunnel were negative on exam today. He prefers to monitor symptoms rather than pursue imaging or physical therapy.  Right thumb osteoarthritis Right thumb pain and stiffness, likely due to osteoarthritis, with visible joint hypertrophy. Symptoms are manageable.  Chronic cough Chronic cough persists, fluctuating in severity and sometimes associated with sneezing. Previous chest x-ray was normal. Potential allergy contribution discussed. He prefers to defer further imaging.  No unintentional weight loss or worsening fatigue  Gilbert syndrome Bilirubin levels are slightly elevated, consistent with baseline Gilbert syndrome.  Recommended follow up: Return in about 6 months (around 01/04/2025) for physical or sooner if needed.Schedule b4 you leave. Future Appointments  Date Time  Provider Department Center  01/06/2025  9:20 AM Katrinka Garnette KIDD, MD [name]    Lab/Order associations:   ICD-10-CM   1. Hypertension, essential  I10     2. Hyperlipidemia, unspecified hyperlipidemia type  E78.5     3. Essential tremor  G25.0       Meds ordered this encounter  Medications   amLODipine  (NORVASC ) 2.5 MG tablet    Sig: Take 1 tablet (2.5 mg total) by mouth daily.    Dispense:  90 tablet    Refill:  3   rosuvastatin  (CRESTOR ) 20 MG tablet    Sig: TAKE 1 TABLET(20 MG) BY MOUTH DAILY    Dispense:  90 tablet    Refill:  3   hydrochlorothiazide  (HYDRODIURIL ) 12.5 MG tablet    Sig: TAKE 1 TABLET(12.5 MG) BY MOUTH DAILY    Dispense:  90 tablet    Refill:  3    Return precautions advised.  Garnette Katrinka, MD

## 2024-07-07 NOTE — Patient Instructions (Addendum)
 No changes today- glad you are doing well  If change mind on neck or chest x-ray let me know  Continue current medications and gave year supply on all  Recommended follow up: Return in about 6 months (around 01/04/2025) for physical or sooner if needed.Schedule b4 you leave.

## 2024-07-12 ENCOUNTER — Other Ambulatory Visit: Payer: Self-pay | Admitting: Family Medicine

## 2024-07-14 ENCOUNTER — Other Ambulatory Visit: Payer: Self-pay | Admitting: Family Medicine

## 2025-01-06 ENCOUNTER — Encounter: Admitting: Family Medicine

## 2025-03-08 ENCOUNTER — Encounter: Admitting: Family Medicine
